# Patient Record
Sex: Female | Born: 1975 | Race: Black or African American | Hispanic: No | Marital: Married | Smoking: Former smoker
Health system: Southern US, Community
[De-identification: ages and names within clinical notes are randomized; demographics above are authoritative.]

## PROBLEM LIST (undated history)

## (undated) DIAGNOSIS — R87619 Unspecified abnormal cytological findings in specimens from cervix uteri: Secondary | ICD-10-CM

## (undated) DIAGNOSIS — Z9889 Other specified postprocedural states: Secondary | ICD-10-CM

## (undated) DIAGNOSIS — IMO0002 Reserved for concepts with insufficient information to code with codable children: Secondary | ICD-10-CM

## (undated) HISTORY — PX: FINGER SURGERY: SHX640

## (undated) HISTORY — DX: Other specified postprocedural states: Z98.890

## (undated) HISTORY — DX: Unspecified abnormal cytological findings in specimens from cervix uteri: R87.619

## (undated) HISTORY — PX: WISDOM TOOTH EXTRACTION: SHX21

## (undated) HISTORY — DX: Reserved for concepts with insufficient information to code with codable children: IMO0002

---

## 1998-06-21 ENCOUNTER — Ambulatory Visit (HOSPITAL_BASED_OUTPATIENT_CLINIC_OR_DEPARTMENT_OTHER): Admission: RE | Admit: 1998-06-21 | Discharge: 1998-06-21 | Payer: Self-pay | Admitting: Ophthalmology

## 2000-03-03 ENCOUNTER — Emergency Department (HOSPITAL_COMMUNITY): Admission: EM | Admit: 2000-03-03 | Discharge: 2000-03-03 | Payer: Self-pay | Admitting: Emergency Medicine

## 2000-09-03 ENCOUNTER — Emergency Department (HOSPITAL_COMMUNITY): Admission: EM | Admit: 2000-09-03 | Discharge: 2000-09-03 | Payer: Self-pay | Admitting: Emergency Medicine

## 2000-09-03 ENCOUNTER — Encounter: Payer: Self-pay | Admitting: Emergency Medicine

## 2006-10-01 ENCOUNTER — Observation Stay: Payer: Self-pay | Admitting: Obstetrics and Gynecology

## 2006-12-01 ENCOUNTER — Observation Stay: Payer: Self-pay | Admitting: Obstetrics and Gynecology

## 2006-12-07 ENCOUNTER — Inpatient Hospital Stay: Payer: Self-pay | Admitting: Obstetrics and Gynecology

## 2007-06-22 ENCOUNTER — Ambulatory Visit (HOSPITAL_BASED_OUTPATIENT_CLINIC_OR_DEPARTMENT_OTHER): Admission: RE | Admit: 2007-06-22 | Discharge: 2007-06-22 | Payer: Self-pay | Admitting: *Deleted

## 2007-06-22 ENCOUNTER — Encounter (INDEPENDENT_AMBULATORY_CARE_PROVIDER_SITE_OTHER): Payer: Self-pay | Admitting: *Deleted

## 2009-08-03 ENCOUNTER — Emergency Department (HOSPITAL_COMMUNITY): Admission: EM | Admit: 2009-08-03 | Discharge: 2009-08-03 | Payer: Self-pay | Admitting: Emergency Medicine

## 2011-03-21 ENCOUNTER — Inpatient Hospital Stay (HOSPITAL_COMMUNITY): Payer: BC Managed Care – PPO

## 2011-03-21 ENCOUNTER — Observation Stay (HOSPITAL_COMMUNITY)
Admission: AD | Admit: 2011-03-21 | Discharge: 2011-03-22 | DRG: 380 | Disposition: A | Discharge status: Home / Self Care | Payer: BC Managed Care – PPO | Source: Ambulatory Visit | Attending: Obstetrics & Gynecology | Admitting: Obstetrics & Gynecology

## 2011-03-21 DIAGNOSIS — O030 Genital tract and pelvic infection following incomplete spontaneous abortion: Secondary | ICD-10-CM

## 2011-03-21 LAB — ABO/RH: ABO/RH(D): AB POS

## 2011-03-21 LAB — DIFFERENTIAL
Basophils Absolute: 0 10*3/uL (ref 0.0–0.1)
Basophils Relative: 0 % (ref 0–1)
Lymphocytes Relative: 6 % — ABNORMAL LOW (ref 12–46)
Neutro Abs: 22.3 10*3/uL — ABNORMAL HIGH (ref 1.7–7.7)
Neutrophils Relative %: 87 % — ABNORMAL HIGH (ref 43–77)

## 2011-03-21 LAB — WET PREP, GENITAL
Clue Cells Wet Prep HPF POC: NONE SEEN
Yeast Wet Prep HPF POC: NONE SEEN

## 2011-03-21 LAB — CBC
Platelets: 356 10*3/uL (ref 150–400)
RBC: 4 MIL/uL (ref 3.87–5.11)
RDW: 12.9 % (ref 11.5–15.5)
WBC: 25.3 10*3/uL — ABNORMAL HIGH (ref 4.0–10.5)

## 2011-03-22 ENCOUNTER — Other Ambulatory Visit: Payer: Self-pay | Admitting: Obstetrics & Gynecology

## 2011-03-22 LAB — URINE CULTURE
Colony Count: NO GROWTH
Culture  Setup Time: 201204212116

## 2011-03-22 LAB — CBC
HCT: 24.2 % — ABNORMAL LOW (ref 36.0–46.0)
MCH: 30.6 pg (ref 26.0–34.0)
MCV: 89.3 fL (ref 78.0–100.0)
Platelets: 287 10*3/uL (ref 150–400)
RDW: 12.8 % (ref 11.5–15.5)

## 2011-03-23 LAB — GC/CHLAMYDIA PROBE AMP, GENITAL
Chlamydia, DNA Probe: NEGATIVE
GC Probe Amp, Genital: NEGATIVE

## 2011-03-23 LAB — STREP B DNA PROBE

## 2011-03-26 LAB — CROSSMATCH: Unit division: 0

## 2011-03-30 ENCOUNTER — Other Ambulatory Visit: Payer: Self-pay | Admitting: Obstetrics & Gynecology

## 2011-03-31 DEATH — deceased

## 2011-04-01 ENCOUNTER — Encounter (INDEPENDENT_AMBULATORY_CARE_PROVIDER_SITE_OTHER): Payer: BC Managed Care – PPO | Admitting: Obstetrics & Gynecology

## 2011-04-01 ENCOUNTER — Encounter: Payer: BC Managed Care – PPO | Admitting: Obstetrics & Gynecology

## 2011-04-01 DIAGNOSIS — R87619 Unspecified abnormal cytological findings in specimens from cervix uteri: Secondary | ICD-10-CM

## 2011-04-01 DIAGNOSIS — Z113 Encounter for screening for infections with a predominantly sexual mode of transmission: Secondary | ICD-10-CM

## 2011-04-01 DIAGNOSIS — O021 Missed abortion: Secondary | ICD-10-CM

## 2011-04-13 NOTE — Discharge Summary (Signed)
  Danielle West, Danielle West              ACCOUNT NO.:  [id number]  MEDICAL RECORD NO.:  [id number]           PATIENT TYPE:  I  LOCATION:  9373                          FACILITY:  [facility]  PHYSICIAN:  Lesly Dukes, M.D. DATE OF BIRTH:  [birth year]  DATE OF ADMISSION:  03/21/2011 DATE OF DISCHARGE:  03/22/2011                              DISCHARGE SUMMARY   DISCHARGE DIAGNOSIS:  Status post preterm delivery of a nonviable female at approximately 18 weeks and 5 days.  HOSPITAL COURSE:  The patient is a 35 year old G4, para 3-0-0-3, who presented at 18 weeks and 5 days with abdominal pain that had started 24 hours ago.  The patient got prenatal care at [facility] in [place].  The patient had ruptured her membranes just prior to coming to the hospital when she was in the EMS vehicle.  Upon morning of the preterm premature rupture of membranes, the patient was offered Cytotec induction and accepted and went on to deliver the baby at 12:52 a.m. on March 22, 2011.  The placenta delivered several hours later with moderate bleeding.  The patient's white blood cell count was elevated to approximately 26 and she had some uterine tenderness, so Zosyn was started, and she will be discharged with antibiotics with presumptive diagnosis of endometritis.  ADMISSION LABORATORY DATA:  White cell count 25.3, hemoglobin 12.4, hematocrit 36.1, platelets 356.  Postdelivery white cell count 26.6, hemoglobin 8.3, platelet count 287.  The patient is not orthostatic on day of discharge and ambulating well.  MEDICATIONS: 1. Motrin 600 mg one tablet p.o. q.6 h. p.r.n. pain. 2. Percocet 5/325 one to two tablets p.o. q.4 h. p.r.n. pain. 3. Cipro 500 mg one tablet p.o. q.12 h. for 10 days. 4. Flagyl 500 mg one tablet p.o. q.8 h. for 10 days. 5. FeSO4 - 325 one tablet p.o. daily. 6. Docusate 100 mg one tablet p.o. b.i.d.  DISCHARGE DIAGNOSES: 1. Premature delivery at 18 weeks 5 days with cramping  approximately 1     day prior to delivery.  No evidence of incompetent cervix. 2. Presumptive endometritis given white blood cell count and uterine     tenderness.  DISCHARGE INSTRUCTIONS: 1. Call the office or come to the ER with temperature of 100.4 or     greater, severe abdominal pain, intractable nausea, vomiting,     diarrhea. 2. Nothing per vagina for 2 weeks. 3. No work for 1 week.  DISCHARGE FOLLOWUP:  The patient was offered an appointment at [facility] that number was given to her or she can follow up with her primary care doctor in [facility]     Lesly Dukes, M.D.     Danielle West  D:  03/22/2011  T:  03/22/2011  Job:  [id number]  Electronically Signed by Elsie Lincoln M.D. on 04/13/2011 10:28:31 AM

## 2011-04-14 NOTE — Op Note (Signed)
[name], Danielle West              ACCOUNT NO.:  [id number]   MEDICAL RECORD NO.:  [id number]          PATIENT TYPE:  AMB   LOCATION:  DSC                          FACILITY:  [facility]   PHYSICIAN:  Tennis Must Meyerdierks, M.D.DATE OF BIRTH:  08-28-1976   DATE OF PROCEDURE:  06/22/2007  DATE OF DISCHARGE:                               OPERATIVE REPORT   PREOPERATIVE DIAGNOSIS:  Mass, right long finger.   POSTOPERATIVE DIAGNOSIS:  Mass, right long finger.   PROCEDURE:  Excision of mass, right long finger.   SURGEON:  Lowell Bouton, M.D.   ANESTHESIA:  General.   OPERATIVE FINDINGS:  There was no cystic fluid in the mass, and it was  questionable whether or not the mass was a verruca.  It was at the nail  fold and the history given was that it was a bite under water.  The  patient said that she had seen some drainage in the past but not  recently.   PROCEDURE:  Under general anesthesia with a tourniquet on the right arm,  the right hand was prepped and draped in the usual fashion and after  exsanguinating the limb, the tourniquet was inflated to 250 mmHg.  A V-  shaped incision was made just proximal to the mass at the DIP joint.  Sharp dissection was carried through the subcutaneous tissues and a  distally-based flap was elevated.  No cystic fluid was found in the  subcutaneous tissues.  The mass was approached from proximal to distal  and appeared to mainly involve the skin.  The top layer of skin was then  shaved off.  The mass was cauterized with an eye cautery on the skin and  in the soft tissues.  The wound was irrigated with saline.  The skin was  closed with 4-0 nylon sutures.  Marcaine 0.5% digital block was inserted  for pain control.  Sterile dressings were applied.  The patient went to  the recovery room awake in stable, in good condition.      Lowell Bouton, M.D.  Electronically Signed     EMM/MEDQ  D:  06/22/2007  T:  06/22/2007  Job:   [id number]   cc:   Patrica Duel, M.D.

## 2011-05-20 ENCOUNTER — Ambulatory Visit (INDEPENDENT_AMBULATORY_CARE_PROVIDER_SITE_OTHER): Payer: BC Managed Care – PPO | Admitting: Family Medicine

## 2011-05-20 DIAGNOSIS — N898 Other specified noninflammatory disorders of vagina: Secondary | ICD-10-CM

## 2011-05-21 NOTE — Assessment & Plan Note (Signed)
NAME:  Danielle West, Danielle West NO.:  [id number]  MEDICAL RECORD NO.:  [id number]           PATIENT TYPE:  LOCATION:  [facility] at [facility]           FACILITY:  PHYSICIAN:  Tinnie Gens, MD             DATE OF BIRTH:  DATE OF SERVICE:  05/20/2011                                 CLINIC NOTE  CHIEF COMPLAINT:  Yellowish mucousy discharge.  HISTORY OF PRESENT ILLNESS:  The patient is a 35 year old gravida 5, para 3-0-2-3 who comes in today because she has had yellowish discharge since she lost her baby at 75 weeks.  She had a bacterial infection that she thinks is what caused her miscarriage.  She wonders if her husband might be cheating on her.  She comes in today because she has continued to have this yellowish mucousy discharge since then and has an odor occasionally.  She denies fevers, chills, lower abdominal pain, nausea or vomiting.  On exam, vitals are as noted in the chart.  She is well-developed, well- nourished female in no acute distress.  GU, normal external female genitalia.  BUS is normal.  Vagina is pink and rugated.  There is a white mucousy discharge noted.  The cervix is normal in appearance.  It is parous without lesions.  Uterus is an 8-week size, anteverted, firm. There is no cervical motion tenderness.  No adnexal tenderness.  IMPRESSION:  Vaginal discharge of unclear etiology.  PLAN:  Wet prep, GC and Chlamydia, also may be hormonally mediated. Discussed this with the patient and after cycle gets back on track, hopefully this will improve if nothing comes back.  We will call her with results tomorrow.          ______________________________ Tinnie Gens, MD    TP/MEDQ  D:  05/20/2011  T:  05/21/2011  Job:  [id number]

## 2011-05-26 ENCOUNTER — Encounter: Payer: BC Managed Care – PPO | Admitting: Family Medicine

## 2011-06-12 NOTE — Assessment & Plan Note (Signed)
NAME:  Danielle, West NO.:  [id number]  MEDICAL RECORD NO.:  [id number]           PATIENT TYPE:  LOCATION:  [facility] at [facility]           FACILITY:  PHYSICIAN:  Danielle Bossier, MD        DATE OF BIRTH:  Feb 22, 1976  DATE OF SERVICE:  04/01/2011                                 CLINIC NOTE  Danielle West is a 35 year old married black G5, P3, A2.  I first met her 2 weeks ago at the [facility] in the [facility] when she came in with ruptured membranes at 19 weeks estimated gestational age.  Her white count was elevated at that time, but she had no other symptoms.  After a long discussion with her, she did opt for Cytotec induction.  Please note that when she came in with her water broken, she was already contracting.  She spontaneously delivered a nonviable infant followed by the placenta several hours later.  Since she has gone home, she says her bleeding is normal and she reports that her mood is improving on a daily basis.  She denies homicidal or suicidal ideation and she does not feel that she needs medication for depression at this time.  She would, however, like a refill on her acyclovir that she uses on a p.r.n. basis.  PAST MEDICAL HISTORY:  HSV, history of ASCUS Pap.  She says her Pap smear was due in April 2012.  She will do it today.  She has decided to change her care here from her previous OB/GYN in [place].  PHYSICAL EXAMINATION:  Her external genitalia is normal.  Her cervix is parous with a small amount of brownish mucousy discharge.  Her uterus is involuted, normal size, and shape, mobile.  Her adnexa are nontender.  ASSESSMENT/PLAN: 1. A 2-1/2 weeks status post normal spontaneous vaginal delivery of 19-     week fetus.  She is stable. 2. History of atypical squamous cells of undetermined significance     Pap.  I repeated her Pap smear today and will try to obtain the     records from her previous OB/GYN 3. Due to childbearing plans, she would like  to conceive again as soon     as possible.  I recommended that she continue her prenatal vitamins     and use Abstinence for 6 weeks and then condoms for another 6 weeks     prior to trying to conceive.  All questions were answered.  She     will come back in 6 months or sooner as necessary.     Danielle Bossier, MD    MCD/MEDQ  D:  04/01/2011  T:  04/01/2011  Job:  [id number]

## 2011-07-03 ENCOUNTER — Other Ambulatory Visit: Payer: Self-pay | Admitting: *Deleted

## 2011-07-03 DIAGNOSIS — A6000 Herpesviral infection of urogenital system, unspecified: Secondary | ICD-10-CM

## 2011-07-03 MED ORDER — VALACYCLOVIR HCL 1 G PO TABS: 1000.0000 mg | ORAL_TABLET | Freq: Every day | ORAL | Status: AC

## 2011-07-03 NOTE — Telephone Encounter (Signed)
Patient is getting ready to lose her insurance for a year due to school.  She would like to have a refill of her Valtrex called in to be sure she has it in case of a breakout of her HSV.

## 2011-09-10 ENCOUNTER — Ambulatory Visit (INDEPENDENT_AMBULATORY_CARE_PROVIDER_SITE_OTHER): Payer: BC Managed Care – PPO | Admitting: Family Medicine

## 2011-09-10 ENCOUNTER — Encounter: Payer: Self-pay | Admitting: Family Medicine

## 2011-09-10 DIAGNOSIS — F17200 Nicotine dependence, unspecified, uncomplicated: Secondary | ICD-10-CM | POA: Insufficient documentation

## 2011-09-10 DIAGNOSIS — A609 Anogenital herpesviral infection, unspecified: Secondary | ICD-10-CM | POA: Insufficient documentation

## 2011-09-10 DIAGNOSIS — B009 Herpesviral infection, unspecified: Secondary | ICD-10-CM

## 2011-09-10 DIAGNOSIS — A6000 Herpesviral infection of urogenital system, unspecified: Secondary | ICD-10-CM

## 2011-09-10 NOTE — Progress Notes (Signed)
  Subjective:    Patient ID: Danielle West, female    DOB: 30-Jan-1976, 35 y.o.   MRN: [id number]  HPI Here because she was late, but period started.  Actively trying to conceive.  Having HSV outbreaks q o month. Negative UPT at home.   Review of Systems  Constitutional: Negative for activity change and fatigue.  Respiratory: Negative for chest tightness and shortness of breath.   Gastrointestinal: Negative for abdominal pain and abdominal distention.  Genitourinary: Negative for dysuria, menstrual problem and pelvic pain.       Objective:   Physical Exam  Vitals reviewed. Constitutional: She appears well-developed and well-nourished.  HENT:  Head: Normocephalic and atraumatic.  Neck: Normal range of motion.  Cardiovascular: Normal rate.   Pulmonary/Chest: Effort normal.  Abdominal: Soft.  Skin: Skin is warm and dry.          Assessment & Plan:  Frequent HSV outbreaks. Preconception Daily HSV suppression Timing of intercourse reviewed.

## 2011-09-10 NOTE — Patient Instructions (Signed)
Preparing for Pregnancy Preparing for pregnancy (preconceptual care) by getting counseling and information from your caregiver before getting pregnant is a good idea. It will help you and your baby have a better chance to have a healthy, safe pregnancy and delivery of your baby. Make an appointment with your caregiver to talk about your health, medical and family history and how to prepare yourself before getting pregnant. Your caregiver will do a complete physical exam and a Pap test. They will want to know:  About you, your spouse/partner and your family's medical and genetic history.   If you are eating a balanced diet and drinking enough fluids.   What vitamins and mineral supplements you are taking. This includes taking folic acid before getting pregnant to help prevent birth defects.   What medications you are taking including prescription, over-the-counter and herbal medications.   If there is any substance abuse like alcohol, smoking and illegal drugs.   If there is any mental or physical domestic violence.   If there is any risk of sexually transmitted disease between you and your partner.   What immunizations and vaccinations you have had and what you may need before getting pregnant.   If you should get tested for HIV infection.   If there is any exposure to chemical or toxic substances at home or work.   If there are medical problems you have that need to be treated and kept under control before getting pregnant such as diabetes, high blood pressure or others.   If there were any past surgeries, pregnancies and problems with them.   What your current weight is and to set a goal as to how much weight you should gain while pregnant. Also, they will check if you should lose or gain weight before getting pregnant.   What is your exercise routine and what it is safe when you are pregnant.   If there are any physical disabilities that need to be addressed.   About spacing your  pregnancies when there are other children.   If there is a financial problem that may affect you having a child.  After talking about the above points with your caregiver, your caregiver will give you advice on how to help treat and work with you on solving any issues, if necessary, before getting pregnant. The goal is to have a healthy and safe pregnancy for you and your baby. You should keep an accurate record of your menstrual periods because it will help in determining your due date. Immunizations that you should have before getting pregnant:   Regular measles, [place] measles (rubella) and mumps.  Tetanus and diphtheria.   Chicken pox, if not immune.   Herpes zoster (Varicella) if not immune.  Human papilloma virus vaccine (HPV) between the age of 52 and 14 years old).   Hepatitis A vaccine.  Hepatitis B vaccine.   Influenza vaccine.   Pneumococcal vaccine (pneumonia).   You should avoid getting pregnant for one month after getting vaccinated with a live virus vaccine such as [place] measles (rubella) vaccine. Other immunizations may be necessary depending on where you live, such as malaria. Ask your caregiver if any other immunizations are needed for you. HOME CARE INSTRUCTIONS  Follow the advice of your caregiver.   Before getting pregnant:   Begin taking vitamins, supplements and folic acid 0.4 milligrams/day.   Get your immunizations up to date.   Get help from a nutrition counselor if you do not understand what a balanced diet  is, need help with a special medical diet or if you need help to lose or gain weight.   Begin exercising.   Stop smoking, taking illegal drugs and drinking alcoholic beverages.   Get counseling if there is and type of domestic violence.   Get checked for sexually transmitted diseases including HIV.   Get any medical problems under control (diabetes, high blood pressure, convulsions, asthma or others).   Resolve any financial concerns.   Be  sure you and your spouse/partner are ready to have a baby.   Keep an accurate record of your menstrual periods.  Document Released: 10/29/2008  [facility] Patient Information 2011 [place], [place].Herpes Simplex   Herpes simplex is generally classified as Type 1 or Type 2.  Type 1 is generally the type that is responsible for cold sores.  Type 2 is generally associated with sexually transmitted diseases.  We now know that most of the thoughts on these viruses are inaccurate.  We find that HSV1 is also present genitally and HSV2 can be present orally, but this will vary in different locations of the world.  Herpes simplex is usually detected by doing a culture. Blood tests are also available for this virus; however, the accuracy is often not as good.    PREPARATION FOR TEST: No preparation or fasting is necessary.   NORMAL FINDINGS:  No virus present  No HSV antigens or antibodies present   MEANING OF TEST Your caregiver will go over the test results with you and discuss the importance and meaning of your results, as well as treatment options and the need for additional tests if necessary.   OBTAINING THE TEST RESULTS It is your responsibility to obtain your test results.  Ask the lab or department performing the test when and how you will get your results.   **"Normal" ranges for lab values and other tests may vary among different laboratories and/or hospitals.  You should always check with your doctor after having lab work or other tests done to discuss the meaning of your test results and whether or not your values are considered "within normal limits".   Document Released: 12/19/2004  Document Re-Released: 10/29/2008 [facility] Patient Information 2011 [place], [place].

## 2011-09-14 LAB — POCT HEMOGLOBIN-HEMACUE
Hemoglobin: 14.2
Operator id: 208731

## 2011-12-10 ENCOUNTER — Ambulatory Visit: Payer: BC Managed Care – PPO | Admitting: Obstetrics & Gynecology

## 2012-02-02 ENCOUNTER — Encounter: Payer: Self-pay | Admitting: Obstetrics and Gynecology

## 2012-02-02 ENCOUNTER — Ambulatory Visit (INDEPENDENT_AMBULATORY_CARE_PROVIDER_SITE_OTHER): Payer: BC Managed Care – PPO | Admitting: Obstetrics and Gynecology

## 2012-02-02 VITALS — BP 128/88 | HR 86 | Ht 63.0 in | Wt 137.0 lb

## 2012-02-02 DIAGNOSIS — Z113 Encounter for screening for infections with a predominantly sexual mode of transmission: Secondary | ICD-10-CM

## 2012-02-02 DIAGNOSIS — N760 Acute vaginitis: Secondary | ICD-10-CM

## 2012-02-02 NOTE — Progress Notes (Signed)
36 yo [id number] presenting today for evaluation of vaginal discharge. Patient denies pain or pruritis but reports the presence of thick yellow mucous-like discharge without odor on a weekly basis. Patient is in a monogamous relationship with her husband. Patient also reports experiencing irregular menses, some months being every 26 days, others every 27 days and at times every 28 days. Reassurance provided on this normal variation in her menstrual cycle.  Wet prep and GC/Chl cultures provided Patient will be contacted with any abnormal results.

## 2012-02-03 LAB — WET PREP, GENITAL
Clue Cells Wet Prep HPF POC: NONE SEEN
Trich, Wet Prep: NONE SEEN
Yeast Wet Prep HPF POC: NONE SEEN

## 2012-02-03 LAB — GC/CHLAMYDIA PROBE AMP, GENITAL
Chlamydia, DNA Probe: NEGATIVE
GC Probe Amp, Genital: NEGATIVE

## 2012-09-25 ENCOUNTER — Ambulatory Visit: Payer: Self-pay | Admitting: Internal Medicine

## 2012-11-21 ENCOUNTER — Ambulatory Visit: Payer: BC Managed Care – PPO | Admitting: Family Medicine

## 2012-11-25 ENCOUNTER — Ambulatory Visit (INDEPENDENT_AMBULATORY_CARE_PROVIDER_SITE_OTHER): Payer: BC Managed Care – PPO | Admitting: Family Medicine

## 2012-11-25 ENCOUNTER — Encounter: Payer: Self-pay | Admitting: Family Medicine

## 2012-11-25 VITALS — BP 120/78 | HR 103 | Temp 98.5°F | Ht 63.0 in | Wt 137.0 lb

## 2012-11-25 DIAGNOSIS — N926 Irregular menstruation, unspecified: Secondary | ICD-10-CM

## 2012-11-25 DIAGNOSIS — N912 Amenorrhea, unspecified: Secondary | ICD-10-CM

## 2012-11-25 DIAGNOSIS — J069 Acute upper respiratory infection, unspecified: Secondary | ICD-10-CM

## 2012-11-25 LAB — POCT URINE PREGNANCY: Preg Test, Ur: NEGATIVE

## 2012-11-25 NOTE — Progress Notes (Signed)
   Nature conservation officer at [facility] [address] [place] [address] Phone: [phone] Fax: [phone]  Date:  11/25/2012   Name:  Danielle West   DOB:  Jan 10, 1976   MRN:  [id number] Gender: female Age: 36 y.o.  PCP:  Hannah Beat, MD  Evaluating MD: Hannah Beat, MD   Chief Complaint: Establish Care   History of Present Illness:  Danielle West is a 36 y.o. pleasant patient who presents with the following:  Est care and  Trying to get pregnant for 6 months.  36 yo and 36 yo.  The patient has some runny nose, nasal congestion and some occasional cough. She is afebrile. She is somewhat tired. She is eating and drinking normally. No nausea, vomiting, or diarrhea.  Paps -- goes to Dr. Toya Smothers.     There is no problem list on file for this patient.   No past medical history on file.  No past surgical history on file.  History  Substance Use Topics  . Smoking status: Current Some Day Smoker  . Smokeless tobacco: Not on file  . Alcohol Use: Yes    No family history on file.  Allergies not on file  Medication list has been reviewed and updated.  No outpatient prescriptions prior to visit.    Last reviewed on 11/25/2012  1:58 PM by Consuello Masse, CMA  Review of Systems:  ROS: GEN: Acute illness details above GI: Tolerating PO intake GU: maintaining adequate hydration and urination Pulm: No SOB Interactive and getting along well at home.  Otherwise, ROS is as per the HPI.   Physical Examination: BP 120/78  Pulse 103  Temp 98.5 F (36.9 C) (Oral)  Ht 5\' 3"  (1.6 m)  Wt 137 lb (62.143 kg)  BMI 24.27 kg/m2  SpO2 98%  Ideal Body Weight: Weight in (lb) to have BMI = 25: 140.8    Gen: WDWN, NAD; A & O x3, cooperative. Pleasant.Globally Non-toxic HEENT: Normocephalic and atraumatic. Throat clear, w/o exudate, R TM clear, L TM - good landmarks, No fluid present. rhinnorhea.  MMM Frontal sinuses: NT Max sinuses: NT NECK:  Anterior cervical  LAD is absent CV: RRR, No M/G/R, cap refill <2 sec PULM: Breathing comfortably in no respiratory distress. no wheezing, crackles, rhonchi EXT: No c/c/e PSYCH: Friendly, good eye contact MSK: Nml gait    Assessment and Plan:  1. URI (upper respiratory infection)    2. Missed menses  POCT urine pregnancy   Reviewed supportive URI care.  Missed menses, and UPT is negative. I suggested that she obtain some home pregnancy test and check every 3-4 days as she is actively trying to get pregnant.  Results for orders placed in visit on 11/25/12  POCT URINE PREGNANCY      Component Value Range   Preg Test, Ur Negative       Orders Today:  Orders Placed This Encounter  Procedures  . POCT urine pregnancy    Updated Medication List: (Includes new medications, updates to list, dose adjustments) No orders of the defined types were placed in this encounter.    Medications Discontinued: There are no discontinued medications.   Hannah Beat, MD

## 2012-12-31 HISTORY — PX: COLPOSCOPY: SHX161

## 2013-01-27 ENCOUNTER — Ambulatory Visit: Payer: BC Managed Care – PPO | Admitting: Family Medicine

## 2013-04-05 ENCOUNTER — Encounter: Payer: BC Managed Care – PPO | Admitting: *Deleted

## 2013-10-21 ENCOUNTER — Inpatient Hospital Stay: Payer: Self-pay | Admitting: Obstetrics and Gynecology

## 2013-10-21 LAB — CBC WITH DIFFERENTIAL/PLATELET
Basophil #: 0.1 10*3/uL (ref 0.0–0.1)
Eosinophil #: 0.1 10*3/uL (ref 0.0–0.7)
Eosinophil %: 0.4 %
HGB: 12.2 g/dL (ref 12.0–16.0)
Lymphocyte #: 1.4 10*3/uL (ref 1.0–3.6)
MCH: 29.9 pg (ref 26.0–34.0)
MCHC: 33.6 g/dL (ref 32.0–36.0)
MCV: 89 fL (ref 80–100)
Monocyte #: 1 x10 3/mm — ABNORMAL HIGH (ref 0.2–0.9)
Monocyte %: 7.8 %
Neutrophil #: 10.3 10*3/uL — ABNORMAL HIGH (ref 1.4–6.5)
Platelet: 355 10*3/uL (ref 150–440)
RDW: 15.4 % — ABNORMAL HIGH (ref 11.5–14.5)
WBC: 12.7 10*3/uL — ABNORMAL HIGH (ref 3.6–11.0)

## 2014-01-01 LAB — HM PAP SMEAR

## 2014-01-01 LAB — HM COLONOSCOPY

## 2014-02-14 ENCOUNTER — Ambulatory Visit: Payer: BC Managed Care – PPO | Admitting: Family Medicine

## 2014-03-05 HISTORY — PX: LEEP: SHX91

## 2014-05-10 IMAGING — CR DG LUMBAR SPINE AP/LAT/OBLIQUES W/ FLEX AND EXT
1 series · 6 of 6 positions shown · non-contrast
Comparison: none

REASON FOR EXAM: low back pain, tenderness and numbness
COMMENTS:

PROCEDURE:     DXR - DXR LUMBAR SPINE WITH OBLIQUES  - September 25, 2012 [DATE]
RESULT:     Five non-rib bearing lumbar vertebral bodies are appreciated.
There is no evidence of fracture, dislocation or malalignment.

[Series 1: t lumbar spine ap · 0.14mm/px · 6 of 6 slices shown]
[im 1/6]
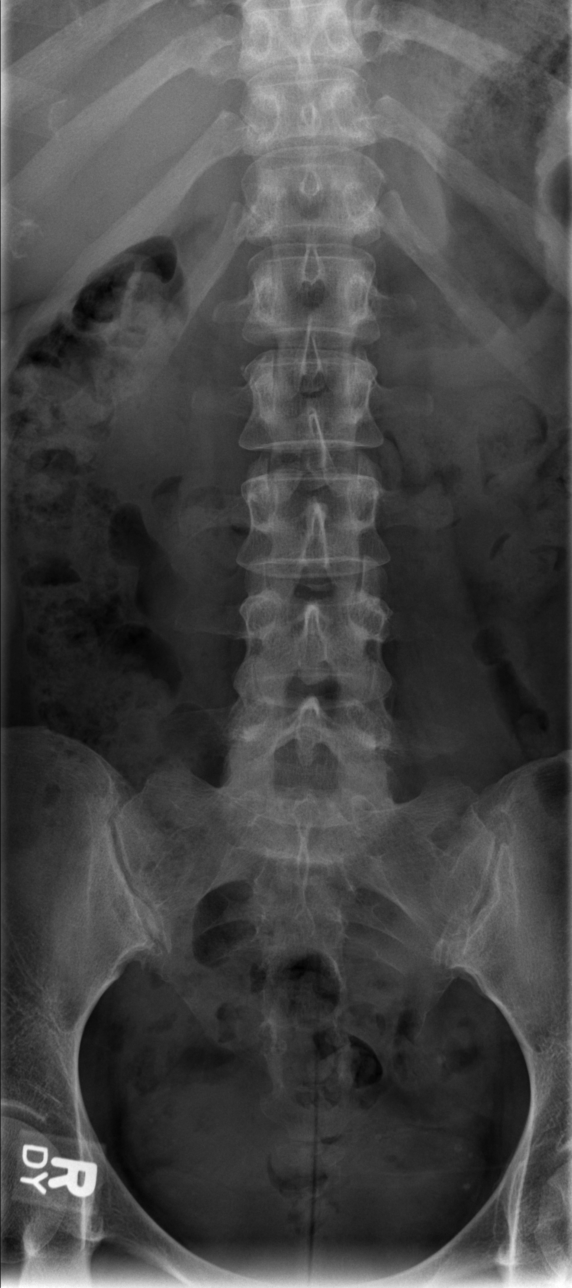
[im 2/6]
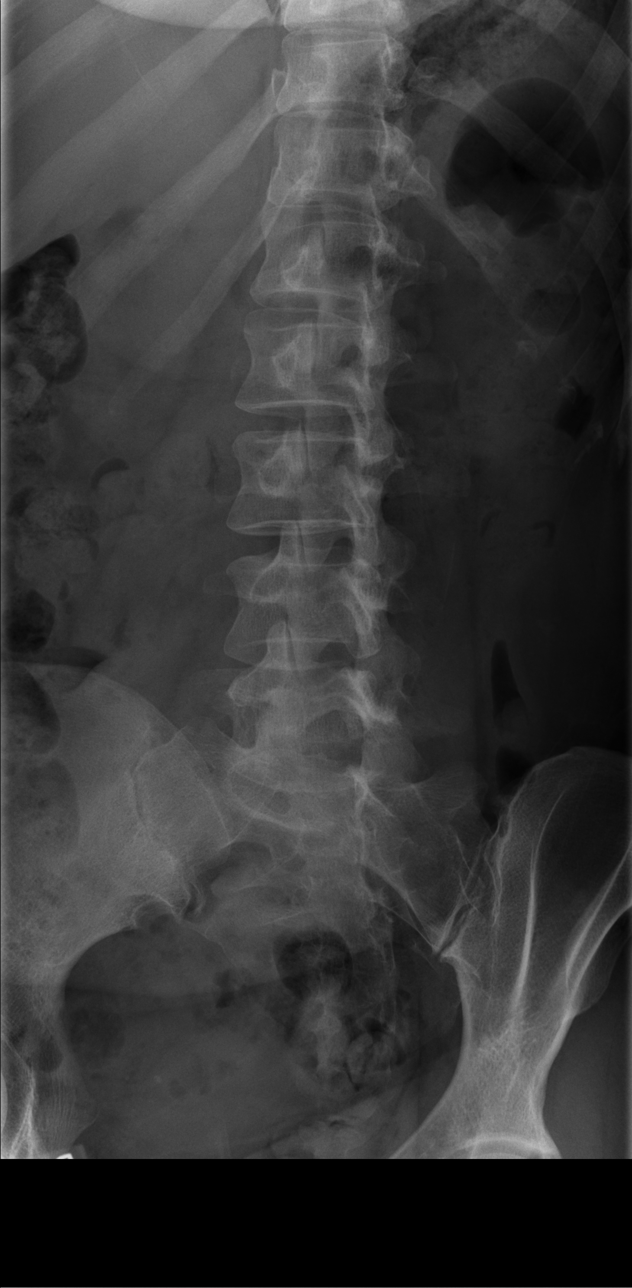
[im 3/6]
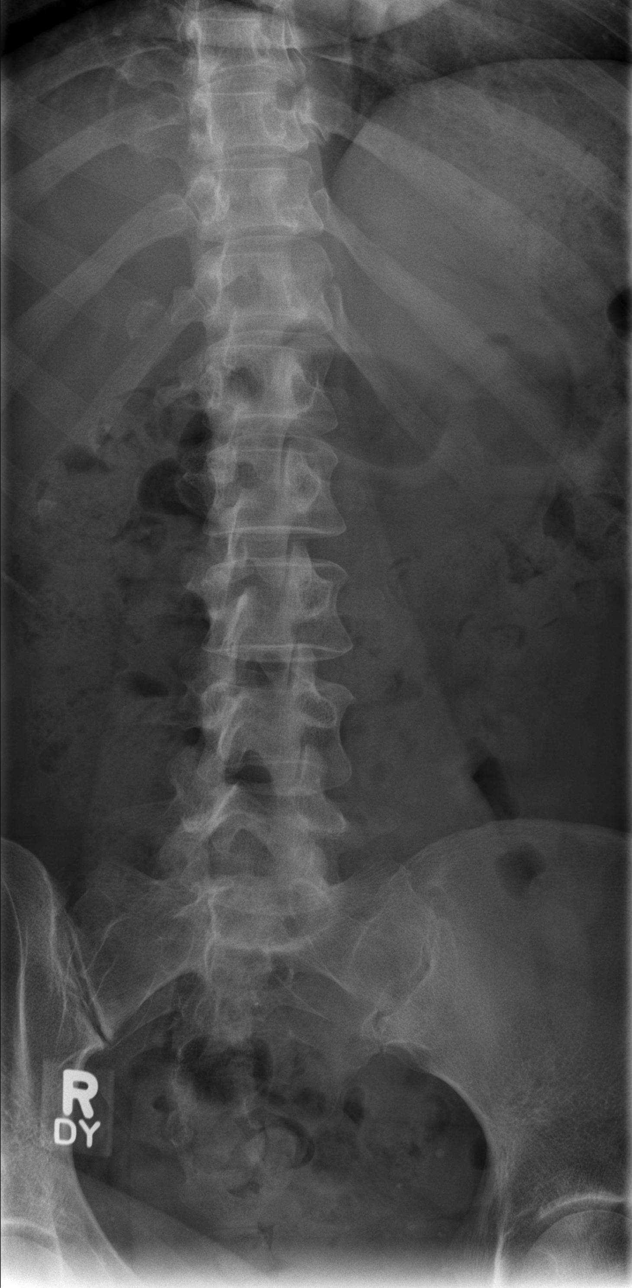
[im 4/6]
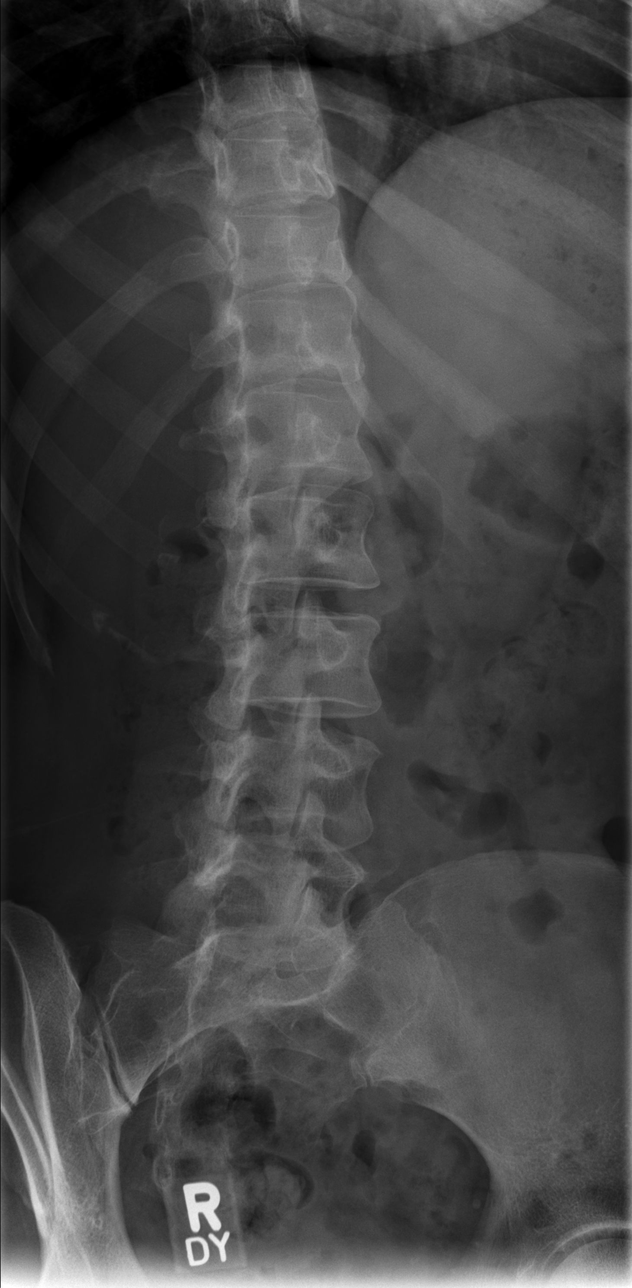
[im 5/6]
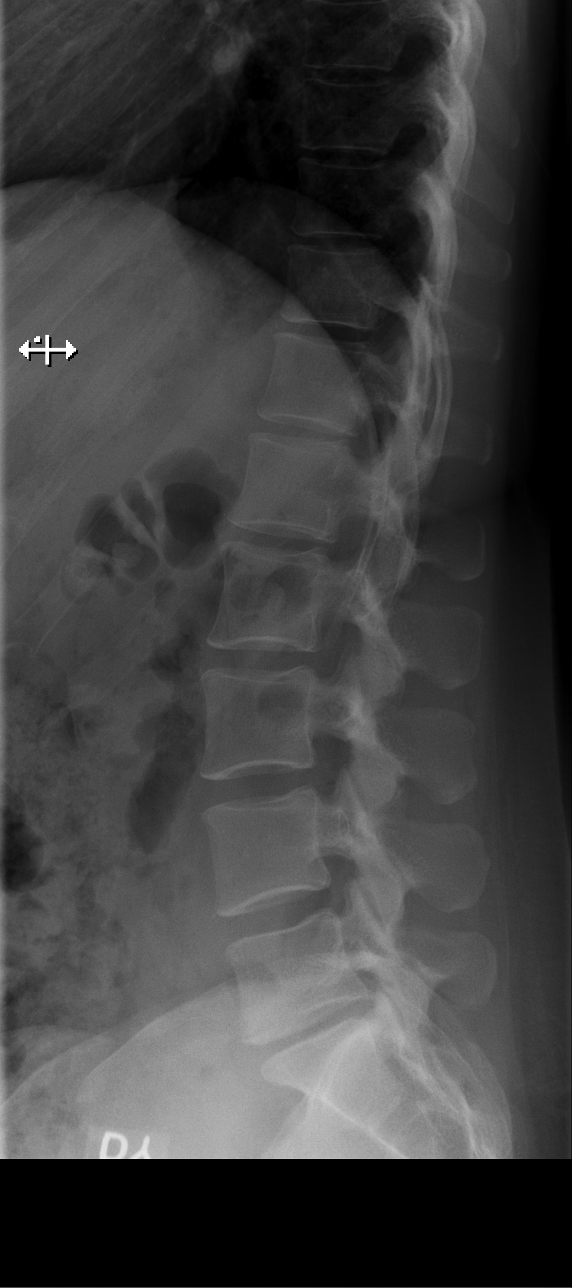
[im 6/6]
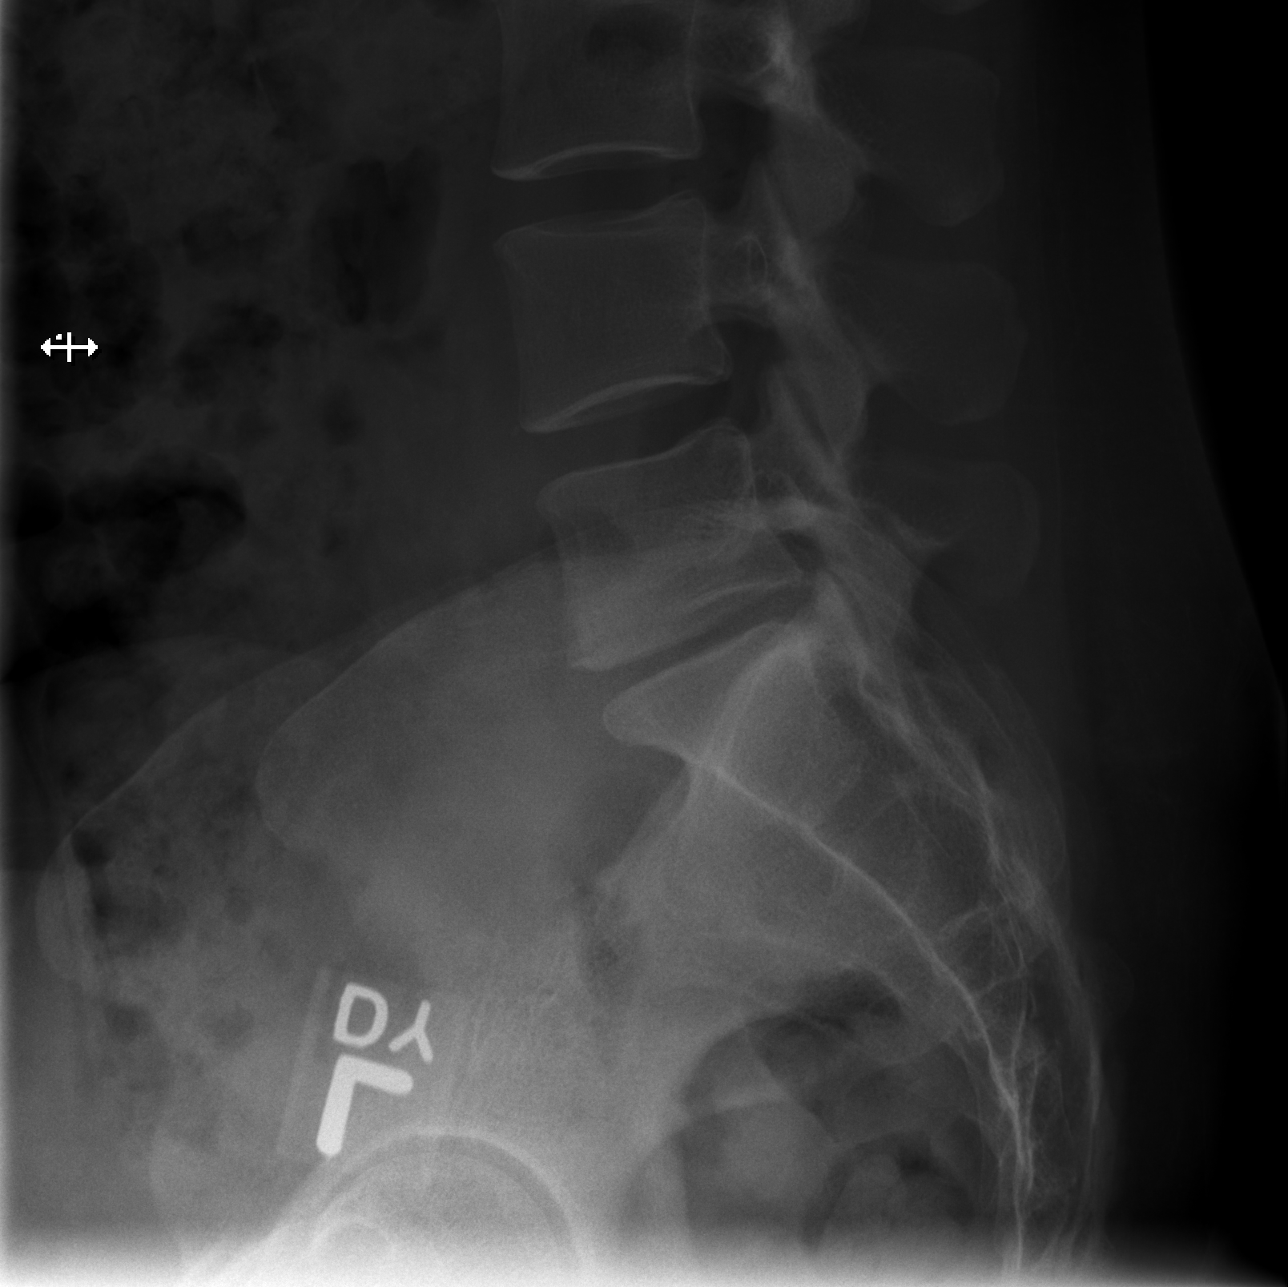

[6 of 6 positions shown; findings below may reference images not displayed]

IMPRESSION: 1. No acute osseous abnormalities.
2. If there is persistent clinical concern or persistent complaints of pain,
further evaluation with MRI is recommended.

## 2014-05-21 ENCOUNTER — Encounter: Payer: Self-pay | Admitting: Obstetrics and Gynecology

## 2014-10-01 ENCOUNTER — Encounter: Payer: Self-pay | Admitting: Obstetrics and Gynecology

## 2014-10-10 LAB — HM PAP SMEAR: HM PAP: NEGATIVE

## 2014-10-10 LAB — HM COLONOSCOPY

## 2015-03-01 HISTORY — PX: THERAPEUTIC ABORTION: SHX798

## 2015-04-09 NOTE — H&P (Signed)
L&D Evaluation:  History:  HPI 37 yoAAF [id number], EGA 39.0 weeks, estimated date of confinement 10/28/2013, admitted for IOL. 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   Patient's Medical History AMA; HSV 2; ASCUS Pap with +HPV   Patient's Surgical History none   Medications Pre Natal Vitamins  Acyclovir   Allergies NKDA   Social History none   Family History Non-Contributory   ROS:  ROS All systems were reviewed.  HEENT, CNS, GI, GU, Respiratory, CV, Renal and Musculoskeletal systems were found to be normal.   Exam:  Vital Signs stable   Urine Protein negative dipstick   General no apparent distress   Mental Status clear   Abdomen gravid, non-tender   Estimated Fetal Weight Average for gestational age, 7#6 EFW   Back no CVAT   Edema no edema   Pelvic no external lesions, 4/40/-3/VTX/AROM Clear   Mebranes Ruptured   Description clear   FHT normal rate with no decels   Skin dry, no lesions, no rashes   Lymph no lymphadenopathy   Other AB+/Atb-/NR/RI/VI/HB-/HIV-/GBS-/MSAFP quad-   Impression:  Impression TIUP; Hx HSV, on prophylaxis, without outbreak/prodrome   Plan:  Plan Pitocin IOL; Anticipate spontaneous vaginal delivery   Electronic Signatures: Anastacio Bua, Prentice DockerMartin A (MD)  (Signed [phone]-Nov-14 11:21)  Authored: L&D Evaluation   Last Updated: 22-Nov-14 11:21 by Vanity Larsson, Prentice DockerMartin A (MD)

## 2015-04-09 NOTE — H&P (Signed)
L&D Evaluation:  History:  HPI [age] yoaaf G6P2123, estimated date of confinement 10/28/2013, EGA 39.0 weeks admitted for IOL   Patient's Medical History HSV 2; AMA; ASC   Electronic Signatures: Parsa Rickett, Prentice DockerMartin A (MD)  (Signed [phone]-Nov-14 11:08)  Authored: L&D Evaluation   Last Updated: 22-Nov-14 11:08 by Akita Maxim, Prentice DockerMartin A (MD)

## 2015-05-16 ENCOUNTER — Ambulatory Visit (INDEPENDENT_AMBULATORY_CARE_PROVIDER_SITE_OTHER): Payer: BC Managed Care – PPO | Admitting: Obstetrics and Gynecology

## 2015-05-16 ENCOUNTER — Telehealth: Payer: Self-pay | Admitting: Obstetrics and Gynecology

## 2015-05-16 ENCOUNTER — Encounter: Payer: Self-pay | Admitting: Obstetrics and Gynecology

## 2015-05-16 VITALS — BP 162/98 | HR 102 | Ht 65.0 in | Wt 147.5 lb

## 2015-05-16 DIAGNOSIS — Z3201 Encounter for pregnancy test, result positive: Secondary | ICD-10-CM | POA: Diagnosis not present

## 2015-05-16 DIAGNOSIS — B379 Candidiasis, unspecified: Secondary | ICD-10-CM

## 2015-05-16 DIAGNOSIS — N912 Amenorrhea, unspecified: Secondary | ICD-10-CM | POA: Diagnosis not present

## 2015-05-16 LAB — POCT URINE PREGNANCY: PREG TEST UR: POSITIVE — AB

## 2015-05-16 MED ORDER — SERTRALINE HCL 50 MG PO TABS: 50.0000 mg | ORAL_TABLET | Freq: Every day | ORAL | Status: DC

## 2015-05-16 MED ORDER — FLUCONAZOLE 150 MG PO TABS: 150.0000 mg | ORAL_TABLET | Freq: Once | ORAL | Status: DC

## 2015-05-16 NOTE — Telephone Encounter (Signed)
Talked with pharmacy, they deleted order, and ask [place] to send another one.  This was done-quantity #30, 1 refill.

## 2015-05-16 NOTE — Telephone Encounter (Signed)
Pharmacy [facility] in [place] called wants a call back from you to verify the quantity of the RX that was sent in for [place], pharmacy tech's name is Tobi Bastos and number is [phone]

## 2015-05-16 NOTE — Progress Notes (Deleted)
Patient ID: Danielle West, female   DOB: [birth year], 39 y.o.   MRN: [id number] ANNUAL PREVENTATIVE CARE GYN  ENCOUNTER NOTE   Current Outpatient Prescriptions on File Prior to Visit  Medication Sig Dispense Refill  . valACYclovir (VALTREX) 1000 MG tablet Take 1 tablet (1,000 mg total) by mouth daily. (Patient taking differently: Take 1,000 mg by mouth as needed. ) 30 tablet 3  . Prenatal Vit-Fe Fumarate-FA (MULTIVITAMIN-PRENATAL) 27-0.8 MG TABS Take 1 tablet by mouth daily.       No current facility-administered medications on file prior to visit.    Allergies  Allergen Reactions  . Sulfa Antibiotics Rash    History   Social History  . Marital Status: Married    Spouse Name: N/A  . Number of Children: N/A  . Years of Education: N/A   Occupational History  . teacher    Social History Main Topics  . Smoking status: Current Some Day Smoker -- 0.30 packs/day    Types: Cigarettes  . Smokeless tobacco: Never Used  . Alcohol Use: 0.0 oz/week    0 Standard drinks or equivalent per week     Comment: ocasionally   . Drug Use: No  . Sexual Activity:    Partners: Male   Other Topics Concern  . Not on file   Social History Narrative   ** Merged History Encounter **        Family History  Problem Relation Age of Onset  . Diabetes Maternal Grandmother   . Hypertension Father   . Cancer Father     thyriod, prostate  . Diabetes Mother   . Hypertension Mother   . Cancer Mother     breast cancer  . Cancer Brother     kidney  . Diabetes Sister   . Lupus Maternal Aunt   . Cancer Paternal Grandmother     breast cancer    The following portions of the patient's history were reviewed and updated as appropriate: allergies, current medications, past family history, past medical history, past social history, past surgical history and problem list.  Review of Systems ROS Review of Systems - General ROS: negative for - chills, fatigue, fever, hot flashes, night sweats, weight  gain or weight loss Psychological ROS: negative for - anxiety, decreased libido, depression, mood swings, physical abuse or sexual abuse Ophthalmic ROS: negative for - blurry vision, eye pain or loss of vision ENT ROS: negative for - headaches, hearing change, visual changes or vocal changes Allergy and Immunology ROS: negative for - hives, itchy/watery eyes or seasonal allergies Hematological and Lymphatic ROS: negative for - bleeding problems, bruising, swollen lymph nodes or weight loss Endocrine ROS: negative for - galactorrhea, hair pattern changes, hot flashes, malaise/lethargy, mood swings, palpitations, polydipsia/polyuria, skin changes, temperature intolerance or unexpected weight changes Breast ROS: negative for - new or changing breast lumps or nipple discharge Respiratory ROS: negative for - cough or shortness of breath Cardiovascular ROS: negative for - chest pain, irregular heartbeat, palpitations or shortness of breath Gastrointestinal ROS: no abdominal pain, change in bowel habits, or black or bloody stools Genito-Urinary ROS: no dysuria, trouble voiding, or hematuria Musculoskeletal ROS: negative for - joint pain or joint stiffness Neurological ROS: negative for - bowel and bladder control changes Dermatological ROS: negative for rash and skin lesion changes   Objective:   BP 162/98 mmHg  Pulse 102  Ht  (1.651 m)  Wt 147 lb 8 oz (66.906 kg)  BMI 24.55 kg/m2  LMP 04/12/2015 (Exact  Date) CONSTITUTIONAL: Well-developed, well-nourished female in no acute distress.  PSYCHIATRIC: Normal mood and affect. Normal behavior. Normal judgment and thought content. NEUROLGIC: Alert and oriented to person, place, and time. Normal muscle tone coordination. No cranial nerve deficit noted. HENT:  Normocephalic, atraumatic, External right and left ear normal. Oropharynx is clear and moist EYES: Conjunctivae and EOM are normal. Pupils are equal, round, and reactive to light. No scleral  icterus.  NECK: Normal range of motion, supple, no masses.  Normal thyroid.  SKIN: Skin is warm and dry. No rash noted. Not diaphoretic. No erythema. No pallor. CARDIOVASCULAR: Normal heart rate noted, regular rhythm, no murmur. RESPIRATORY: Clear to auscultation bilaterally. Effort and breath sounds normal, no problems with respiration noted. BREASTS: Symmetric in size. No masses, skin changes, nipple drainage, or lymphadenopathy. ABDOMEN: Soft, normal bowel sounds, no distention noted.  No tenderness, rebound or guarding.  BLADDER: Normal PELVIC:  External Genitalia: Normal  BUS: Normal  Vagina: Normal  Cervix: Normal  Uterus: Normal  Adnexa: Normal  RV: {Blank multiple:19196::"External Exam NormaI","No Rectal Masses","Normal Sphincter tone"}  MUSCULOSKELETAL: Normal range of motion. No tenderness.  No cyanosis, clubbing, or edema.  2+ distal pulses. LYMPHATIC: No Axillary, Supraclavicular, or Inguinal Adenopathy.    Assessment:   Annual gynecologic examination 39 y.o. Contraception: {method:5051} {Blank multiple:19196::"Normal BMI","Overweight","Obesity 1","Obesity 2"} Problem List Items Addressed This Visit    None    Visit Diagnoses    Absence of menstruation    -  Primary    Relevant Orders    POCT urine pregnancy (Completed)    hCG, quantitative, pregnancy    Positive urine pregnancy test        Relevant Orders    hCG, quantitative, pregnancy    Yeast infection        Relevant Medications    fluconazole (DIFLUCAN) 150 MG tablet       Plan:  Pap: {Blank multiple:19196::"Pap, Reflex if ASCUS","Pap Co Test","GC/CT NAAT","Not needed","Not done"} Mammogram: {Blank multiple:19196::"Ordered","Not Ordered","Not Indicated","***"} Stool Guaiac Testing:  {Blank multiple:19196::"Ordered","Not Ordered","Not Indicated","***"} Labs: {Blank multiple:19196::"Lipid 1","FBS","TSH","Hemoglobin A1C","Vit D Level""***"} Routine preventative health maintenance measures emphasized:  {Blank multiple:19196::"Exercise/Diet/Weight control","Tobacco Warnings","Alcohol/Substance use risks","Stress Management","Peer Pressure Issues","Safe Sex"} *** Return to Clinic - 1 Year   Fenton Malling, LPN

## 2015-05-16 NOTE — Progress Notes (Signed)
Patient ID: Danielle West, female   DOB: [birth year], 39 y.o.   MRN: [id number].   CHIEF COMPLAINT:  Pt. States she had a medical abortion in 03/2015 and her f/u visit was fine. UPT was positive there but that was expected. LMP 04/12/2015 and had not had one in June but thinks it may be starting now. Home pregnancy was positive on Monday 05/13/2015. Did not use back up protection the month of June. Also c/o vaginal discharge: white, chunks and OTC medication in May but it has returned.   GYN ENCOUNTER NOTE  Subjective:       Danielle West is a 39 y.o. [phone] female is here for gynecologic evaluation of the following issues:  1. Amenorrhea. 2.  Vaginal discharge. 3.  Follow-up on recent abortion. 4.  Anxiety/stress.  As noted systole underwent therapeutic abortion recently; missed menses in June.  She now has started some mild vaginal bleeding and has pelvic cramping.  She also is complaining of vaginal discharge with itching. Patient is tearful today, voicing stressors because of her recent abortion and conflict with her husband regarding keeping or aborting the pregnancy.  Husband wanted her to keep the baby.  She is not suicidal.   Gynecologic History Patient's last menstrual period was 04/12/2015 (exact date). Contraception: condoms  Obstetric History OB History  Gravida Para Term Preterm AB SAB TAB Ectopic Multiple Living  # Outcome Date GA Lbr Len/2nd Weight Sex Delivery Anes PTL Lv  6 AB 03/2015        FD  5 SAB 03/21/11 [redacted]w[redacted]d         4 Term 12/07/06 [redacted]w[redacted]d  7 lb 6 oz (3.345 kg) M    Y  3 Term 06/17/04 [redacted]w[redacted]d  7 lb 3 oz (3.26 kg) F Vag-Spont   Y  2 Term 09/27/96 [redacted]w[redacted]d  6 lb 13 oz (3.09 kg) F Vag-Spont   Y  1 TAB               Past Medical History  Diagnosis Date  . Abnormal Pap smear   . History of colposcopy with cervical biopsy     Past Surgical History  Procedure Laterality Date  . Wisdom tooth extraction    . Finger surgery      right  middle finger  . Therapeutic abortion  03/2015    [facility] Choice via pill  . 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 Colposcopy  12/2012    cx bx-negative/s/p cryosurgery x1    Current Outpatient Prescriptions on File Prior to Visit  Medication Sig Dispense Refill  . valACYclovir (VALTREX) 1000 MG tablet Take 1 tablet (1,000 mg total) by mouth daily. (Patient taking differently: Take 1,000 mg by mouth as needed. ) 30 tablet 3   No current facility-administered medications on file prior to visit.    Allergies  Allergen Reactions  . Sulfa Antibiotics Rash    History   Social History  . Marital Status: Married    Spouse Name: N/A  . Number of Children: N/A  . Years of Education: N/A   Occupational History  . teacher    Social History Main Topics  . Smoking status: Current Some Day Smoker -- 0.30 packs/day    Types: Cigarettes  . Smokeless tobacco: Never Used  . Alcohol Use: 0.0 oz/week    0 Standard drinks or equivalent per week  Comment: ocasionally   . Drug Use: No  . Sexual Activity:    Partners: Male   Other Topics Concern  . Not on file   Social History Narrative   ** Merged History Encounter **        Family History  Problem Relation Age of Onset  . Diabetes Maternal Grandmother   . Hypertension Father   . Cancer Father     thyriod, prostate  . Diabetes Mother   . Hypertension Mother   . Cancer Mother     breast cancer  . Cancer Brother     kidney  . Diabetes Sister   . Lupus Maternal Aunt   . Cancer Paternal Grandmother     breast cancer    The following portions of the patient's history were reviewed and updated as appropriate: allergies, current medications, past family history, past medical history, past social history, past surgical history and problem list.  Review of Systems Review of Systems - General ROS: negative for - chills, fatigue, fever, hot flashes, malaise or night sweats Hematological and Lymphatic ROS: negative for -  bleeding problems or swollen lymph nodes Gastrointestinal ROS: negative for -  blood in stools, change in bowel habits and nausea/vomiting. POSITIVE-abdominal pain Musculoskeletal ROS: negative for - joint pain, muscle pain or muscular weakness Genito-Urinary ROS: negative for - , dysmenorrhea, dyspareunia, dysuria, genital discharge, genital ulcers, hematuria, incontinence, irregular/heavy menses, nocturia or pelvic pain. POSITIVE-change in menstrual cycle  Objective:   BP 162/98 mmHg  Pulse 102  Ht 5\' 5"  (1.651 m)  Wt 147 lb 8 oz (66.906 kg)  BMI 24.55 kg/m2  LMP 04/12/2015 (Exact Date) CONSTITUTIONAL: Well-developed, well-nourished female,Tearful, anxious HENT:  Normocephalic, atraumatic.  SKIN: Skin is warm and dry. No rash noted. Not diaphoretic. No erythema. No pallor. NEUROLGIC: Alert and oriented to person, place, and time. PSYCHIATRIC: Normal mood and affect. Normal behavior. Normal judgment and thought content. CARDIOVASCULAR:Not Examined RESPIRATORY: Not Examined BREASTS: Not Examined ABDOMEN: Soft, non distended; Non tender.  No Organomegaly. PELVIC:  External Genitalia: Normal  BUS: Normal  Vagina: Normal; Menstrual blood in vault  Cervix: Normal; No cervical motion tenderness  Uterus: Anteverted, approximately 8 weeks size; Normal shape,consistency, mobile, 1/4 tender  Adnexa: Normal  RV: Normal External exam  Bladder: Nontender MUSCULOSKELETAL: Normal range of motion. No tenderness.  No cyanosis, clubbing, or edema.     Assessment:   1. Absence of menstruation; History of recent therapeutic abortion  - POCT urine pregnancy - hCG, quantitative, pregnancy  2. Positive urine pregnancy test  - hCG, quantitative, pregnancy  3. Yeast infection, Likely, with inability to check because of new onset menses  - fluconazole (DIFLUCAN) 150 MG tablet; Take 1 tablet (150 mg total) by mouth once.  Dispense: 1 tablet; Refill: 0  4.  Elevated blood pressure, likely  stress-related     Plan:  1.  Diflucan 150 mg by mouth 1. 2.  Weekly quantitative hCGs until 0; if hCG is high, not declining, further assessment will be made. 3.  Start Zoloft 50 mg a day. 4.  Follow-up in 6 weeks for reassessment.

## 2015-05-18 LAB — SPECIMEN STATUS REPORT

## 2015-05-18 LAB — BETA HCG QUANT (REF LAB): HCG QUANT: 25 m[IU]/mL

## 2015-05-20 ENCOUNTER — Telehealth: Payer: Self-pay

## 2015-05-20 DIAGNOSIS — N912 Amenorrhea, unspecified: Secondary | ICD-10-CM

## 2015-05-20 NOTE — Telephone Encounter (Signed)
DONE.the patient IS ON LAB SCHED FOR 6/23

## 2015-05-20 NOTE — Telephone Encounter (Signed)
Pt aware. Beta ordered for 6/23.

## 2015-05-20 NOTE — Telephone Encounter (Signed)
-----   Message from Herold Harms, MD sent at 05/20/2015  6:33 AM EDT ----- Please notify - Abnormal Labs Quant HCG 25. Repeat weekly until negtive.

## 2015-05-23 ENCOUNTER — Other Ambulatory Visit: Payer: BC Managed Care – PPO

## 2015-07-04 ENCOUNTER — Ambulatory Visit: Payer: BC Managed Care – PPO | Admitting: Obstetrics and Gynecology

## 2016-02-25 ENCOUNTER — Encounter: Payer: Self-pay | Admitting: Family Medicine

## 2016-02-25 ENCOUNTER — Ambulatory Visit (INDEPENDENT_AMBULATORY_CARE_PROVIDER_SITE_OTHER): Payer: BC Managed Care – PPO | Admitting: Family Medicine

## 2016-02-25 VITALS — BP 140/80 | HR 79 | Temp 98.0°F | Ht 65.0 in | Wt 151.5 lb

## 2016-02-25 DIAGNOSIS — J309 Allergic rhinitis, unspecified: Secondary | ICD-10-CM | POA: Insufficient documentation

## 2016-02-25 DIAGNOSIS — J301 Allergic rhinitis due to pollen: Secondary | ICD-10-CM | POA: Diagnosis not present

## 2016-02-25 DIAGNOSIS — B351 Tinea unguium: Secondary | ICD-10-CM

## 2016-02-25 MED ORDER — CICLOPIROX 8 % EX SOLN: Freq: Every day | CUTANEOUS | Status: DC

## 2016-02-25 MED ORDER — FLUTICASONE PROPIONATE 50 MCG/ACT NA SUSP: 2.0000 | Freq: Every day | NASAL | Status: DC

## 2016-02-25 NOTE — Progress Notes (Signed)
   Subjective:    Patient ID: Danielle West, female    DOB: 1976-04-11, 40 y.o.   MRN: [id number]  HPI  40 year old female presents with new onset issue with her nails.  2 years ago after pregnancy.. Had toe itching at base of left great toenail. OTC ointment helped but it always returns She has noted  Yellowed nail in left great toenail x 2 years. Has subungual debris.  No skin changes on foot. No other toenail problems.   She has also noted headache, congestion, chills at night 1 weeks ago.   No cough, does not feel bad.  Occ sneeze, occ itchy eyes. Using sudafed, some mild improvement.  Social History /Family History/Past Medical History reviewed and updated if needed.    Review of Systems  Constitutional: Negative for fever and fatigue.  HENT: Negative for ear pain.   Eyes: Negative for pain.  Respiratory: Negative for chest tightness and shortness of breath.   Cardiovascular: Negative for chest pain, palpitations and leg swelling.  Gastrointestinal: Negative for abdominal pain.  Genitourinary: Negative for dysuria.       Objective:   Physical Exam  Constitutional: Vital signs are normal. She appears well-developed and well-nourished. She is cooperative.  Non-toxic appearance. She does not appear ill. No distress.  HENT:  Head: Normocephalic.  Right Ear: Hearing, external ear and ear canal normal. Tympanic membrane is not erythematous, not retracted and not bulging. No middle ear effusion.  Left Ear: Hearing, tympanic membrane, external ear and ear canal normal. Tympanic membrane is not erythematous, not retracted and not bulging.  No middle ear effusion.  Nose: Mucosal edema and rhinorrhea present. Right sinus exhibits no maxillary sinus tenderness and no frontal sinus tenderness. Left sinus exhibits no maxillary sinus tenderness and no frontal sinus tenderness.  Mouth/Throat: Uvula is midline, oropharynx is clear and moist and mucous membranes are normal. No  oropharyngeal exudate, posterior oropharyngeal edema or posterior oropharyngeal erythema.  Eyes: Conjunctivae, EOM and lids are normal. Pupils are equal, round, and reactive to light. Lids are everted and swept, no foreign bodies found.  Neck: Trachea normal and normal range of motion. Neck supple. Carotid bruit is not present. No thyroid mass and no thyromegaly present.  Cardiovascular: Normal rate, regular rhythm, S1 normal, S2 normal, normal heart sounds, intact distal pulses and normal pulses.  Exam reveals no gallop and no friction rub.   No murmur heard. Pulmonary/Chest: Effort normal and breath sounds normal. No tachypnea. No respiratory distress. She has no decreased breath sounds. She has no wheezes. She has no rhonchi. She has no rales.  Neurological: She is alert.  Skin: Skin is warm, dry and intact. No rash noted.  Left great toenail discolored and thickened, mild subungual debris, no rash on foot  Psychiatric: Her speech is normal and behavior is normal. Judgment normal. Her mood appears not anxious. Cognition and memory are normal. She does not exhibit a depressed mood.          Assessment & Plan:

## 2016-02-25 NOTE — Patient Instructions (Addendum)
Start penlac laquer daily. Start flonase 2 sprays per nostril daily. Nasal saline spray or irrigation. Consider Claritin daily.

## 2016-02-25 NOTE — Assessment & Plan Note (Signed)
Discussed options. Pt chose penlac topical  To treat x 1 year. Also spray shoes with antifungal spray.

## 2016-02-25 NOTE — Progress Notes (Signed)
Pre visit review using our clinic review tool, if applicable. No additional management support is needed unless otherwise documented below in the visit note. 

## 2016-04-29 ENCOUNTER — Other Ambulatory Visit: Payer: Self-pay | Admitting: Family Medicine

## 2016-04-29 NOTE — Telephone Encounter (Signed)
Last office visit 02/25/2016 with Dr. Ermalene SearingBedsole.  Last filled 02/25/2016 for 6.6 ml with no refills. Ok to refill?

## 2016-05-14 ENCOUNTER — Encounter: Payer: Self-pay | Admitting: Family Medicine

## 2016-05-14 ENCOUNTER — Telehealth: Payer: Self-pay | Admitting: Family Medicine

## 2016-05-14 ENCOUNTER — Ambulatory Visit (INDEPENDENT_AMBULATORY_CARE_PROVIDER_SITE_OTHER): Payer: BC Managed Care – PPO | Admitting: Family Medicine

## 2016-05-14 VITALS — BP 142/88 | HR 91 | Temp 97.9°F | Ht 65.0 in | Wt 153.0 lb

## 2016-05-14 DIAGNOSIS — J0140 Acute pansinusitis, unspecified: Secondary | ICD-10-CM

## 2016-05-14 DIAGNOSIS — H6692 Otitis media, unspecified, left ear: Secondary | ICD-10-CM

## 2016-05-14 MED ORDER — FLUCONAZOLE 150 MG PO TABS: 150.0000 mg | ORAL_TABLET | Freq: Once | ORAL | Status: DC

## 2016-05-14 MED ORDER — AMOXICILLIN-POT CLAVULANATE 875-125 MG PO TABS
1.0000 | ORAL_TABLET | Freq: Two times a day (BID) | ORAL | Status: DC
Start: 2016-05-14 — End: 2019-02-15

## 2016-05-14 NOTE — Patient Instructions (Addendum)
Please take augmentin as directed with food.  Return to clinic for further evaluation if symptoms do not improve with treatment in 3-4 days, worsen, or you develop a fever >101.   Sinusitis, Adult Sinusitis is redness, soreness, and inflammation of the paranasal sinuses. Paranasal sinuses are air pockets within the bones of your face. They are located beneath your eyes, in the middle of your forehead, and above your eyes. In healthy paranasal sinuses, mucus is able to drain out, and air is able to circulate through them by way of your nose. However, when your paranasal sinuses are inflamed, mucus and air can become trapped. This can allow bacteria and other germs to grow and cause infection. Sinusitis can develop quickly and last only a short time (acute) or continue over a long period (chronic). Sinusitis that lasts for more than 12 weeks is considered chronic. CAUSES Causes of sinusitis include:  Allergies.  Structural abnormalities, such as displacement of the cartilage that separates your nostrils (deviated septum), which can decrease the air flow through your nose and sinuses and affect sinus drainage.  Functional abnormalities, such as when the small hairs (cilia) that line your sinuses and help remove mucus do not work properly or are not present. SIGNS AND SYMPTOMS Symptoms of acute and chronic sinusitis are the same. The primary symptoms are pain and pressure around the affected sinuses. Other symptoms include:  Upper toothache.  Earache.  Headache.  Bad breath.  Decreased sense of smell and taste.  A cough, which worsens when you are lying flat.  Fatigue.  Fever.  Thick drainage from your nose, which often is green and may contain pus (purulent).  Swelling and warmth over the affected sinuses. DIAGNOSIS Your health care provider will perform a physical exam. During your exam, your health care provider may perform any of the following to help determine if you have acute  sinusitis or chronic sinusitis:  Look in your nose for signs of abnormal growths in your nostrils (nasal polyps).  Tap over the affected sinus to check for signs of infection.  View the inside of your sinuses using an imaging device that has a light attached (endoscope). If your health care provider suspects that you have chronic sinusitis, one or more of the following tests may be recommended:  Allergy tests.  Nasal culture. A sample of mucus is taken from your nose, sent to a lab, and screened for bacteria.  Nasal cytology. A sample of mucus is taken from your nose and examined by your health care provider to determine if your sinusitis is related to an allergy. TREATMENT Most cases of acute sinusitis are related to a viral infection and will resolve on their own within 10 days. Sometimes, medicines are prescribed to help relieve symptoms of both acute and chronic sinusitis. These may include pain medicines, decongestants, nasal steroid sprays, or saline sprays. However, for sinusitis related to a bacterial infection, your health care provider will prescribe antibiotic medicines. These are medicines that will help kill the bacteria causing the infection. Rarely, sinusitis is caused by a fungal infection. In these cases, your health care provider will prescribe antifungal medicine. For some cases of chronic sinusitis, surgery is needed. Generally, these are cases in which sinusitis recurs more than 3 times per year, despite other treatments. HOME CARE INSTRUCTIONS  Drink plenty of water. Water helps thin the mucus so your sinuses can drain more easily.  Use a humidifier.  Inhale steam 3-4 times a day (for example, sit in the  bathroom with the shower running).  Apply a warm, moist washcloth to your face 3-4 times a day, or as directed by your health care provider.  Use saline nasal sprays to help moisten and clean your sinuses.  Take medicines only as directed by your health care  provider.  If you were prescribed either an antibiotic or antifungal medicine, finish it all even if you start to feel better. SEEK IMMEDIATE MEDICAL CARE IF:  You have increasing pain or severe headaches.  You have nausea, vomiting, or drowsiness.  You have swelling around your face.  You have vision problems.  You have a stiff neck.  You have difficulty breathing.   This information is not intended to replace advice given to you by your health care provider. Make sure you discuss any questions you have with your health care provider.   Document Released: 11/16/2005 Document Revised: 12/07/2014 Document Reviewed: 12/01/2011 Elsevier Interactive Patient Education [employer]2016 [employer]

## 2016-05-14 NOTE — Telephone Encounter (Signed)
Pt has appt 05/14/16 at 3:30 with Delbert HarnessJulie Kordsmeier FNP.

## 2016-05-14 NOTE — Progress Notes (Signed)
Subjective:    Patient ID: Danielle West, female    DOB: 1976-10-17, 40 y.o.   MRN: [id number]  HPI  Ms. Laural West is a 40 year old female who presents acutely ill lying on exam table today with sneezing and congestion for 8 days. Associated symptoms of chills, ear pain, rhinitis with yellow drainage, frontal sinus pressure/pain, loss of sense of smell and taste, and cough that is nonproductive. Denies N/V/D or history of asthma/bronchitis.  Recent sick exposure of husband and 38 year old daughter.  Treatment at home includespseudoephedrine, tylenol, and tylenol cold/sinus with minimal benefit.  No recent antibiotic use. No aggravating or alleviating factors noted and symptoms are not improving.  Retake of BP is noted as 142/88. Patient reports taking pseudoephedrine. She monitors her BP at home and she reports systolic averages in the 130s and diastolic in the 80s.  Today, she denies any chest pain, palpitations, SOB, headaches, nosebleeds, numbness, and tingling.  Review of Systems  Constitutional: Positive for chills. Negative for fever.  HENT: Positive for ear pain, postnasal drip, rhinorrhea, sinus pressure and sneezing.        Loss of smell and taste  Respiratory: Positive for cough. Negative for shortness of breath.   Cardiovascular: Negative for chest pain, palpitations and leg swelling.  Gastrointestinal: Negative for nausea, vomiting, abdominal pain and diarrhea.  Musculoskeletal: Negative for myalgias.  Skin: Negative for rash.  Neurological: Negative for dizziness, light-headedness and headaches.   Past Medical History  Diagnosis Date  . Abnormal Pap smear   . History of colposcopy with cervical biopsy      Social History   Social History  . Marital Status: Married    Spouse Name: Danielle West  . Number of Children: Danielle West  . Years of Education: Danielle West   Occupational History  . teacher    Social History Main Topics  . Smoking status: Former Smoker -- 0.30 packs/day    Types:  Cigarettes  . Smokeless tobacco: Never Used  . Alcohol Use: 0.0 oz/week    0 Standard drinks or equivalent per week     Comment: ocasionally   . Drug Use: No  . Sexual Activity:    Partners: Male   Other Topics Concern  . Not on file   Social History Narrative   ** Merged History Encounter **        Past Surgical History  Procedure Laterality Date  . Wisdom tooth extraction    . Finger surgery      right middle finger  . [facility]  . 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 Colposcopy  12/2012    cx bx-negative/s/p cryosurgery x1    Family History  Problem Relation Age of Onset  . Diabetes Maternal Grandmother   . Hypertension Father   . Cancer Father     thyriod, prostate  . Diabetes Mother   . Hypertension Mother   . Cancer Mother     breast cancer  . Cancer Brother     kidney  . Diabetes Sister   . Lupus Maternal Aunt   . Cancer Paternal Grandmother     breast cancer    Allergies  Allergen Reactions  . Sulfa Antibiotics Rash    Current Outpatient Prescriptions on File Prior to Visit  Medication Sig Dispense Refill  . ciclopirox (PENLAC) 8 % solution APPLY TOPICALLY AT BEDTIME OVER NAIL AND SURROUNDING SKIN FOR 1 WEEK, THEN  REMOVE AND CONTINUE CYCLE 6.6 mL 0  . fluticasone (FLONASE) 50 MCG/ACT nasal spray Place 2 sprays into both nostrils daily. 16 g 6  . valACYclovir (VALTREX) 1000 MG tablet Take 1 tablet (1,000 mg total) by mouth daily. (Patient taking differently: Take 1,000 mg by mouth as needed. ) 30 tablet 3   No current facility-administered medications on file prior to visit.    BP 142/88 mmHg  Pulse 91  Temp(Src) 97.9 F (36.6 C) (Oral)  Ht  (1.651 m)  Wt 153 lb (69.4 kg)  BMI 25.46 kg/m2  SpO2 99%       Objective:   Physical Exam  Constitutional: She is oriented to person, place, and time. She appears well-developed and well-nourished.  HENT:  Right Ear: Tympanic membrane normal.    Left Ear: Tympanic membrane is erythematous.  Nose: Rhinorrhea present. Right sinus exhibits maxillary sinus tenderness and frontal sinus tenderness. Left sinus exhibits maxillary sinus tenderness and frontal sinus tenderness.  Mouth/Throat: Mucous membranes are normal. No oropharyngeal exudate or posterior oropharyngeal erythema.  Erythematous nasal membranes  Eyes: Pupils are equal, round, and reactive to light. No scleral icterus.  Neck: Neck supple.  Cardiovascular: Normal rate and regular rhythm.  Exam reveals no gallop and no friction rub.   No murmur heard. Pulmonary/Chest: Effort normal and breath sounds normal. She has no wheezes. She has no rales.  Abdominal: Soft. Bowel sounds are normal. There is no tenderness.  Lymphadenopathy:    She has cervical adenopathy.  Neurological: She is alert and oriented to person, place, and time.  Skin: Skin is warm and dry. No rash noted.  Psychiatric: She has a normal mood and affect. Her behavior is normal. Judgment and thought content normal.       Assessment & Plan:  1. Acute pansinusitis, recurrence not specified Exam and history with acutely ill appearance, recent sick contact with 34 year old daughter in daycare, and loss of smell and taste support treatment for sinusitis today.   - amoxicillin-clavulanate (AUGMENTIN) 875-125 MG tablet; Take 1 tablet by mouth 2 (two) times daily.  Dispense: 20 tablet; Refill: 0  2. Acute left otitis media, recurrence not specified, unspecified otitis media type Augmentin treatment for sinusitis and OM.  Further advised patient regarding the use of pseudoephedrine and how this medication can raise BP and heart rate. Discussed use of nasal saline rinse for congestion and sinus symptoms during antibiotic therapy. Also, advised patient on supportive measures:  Get rest, drink plenty of fluids, and use tylenol for discomfort. Follow up if fever >101, if symptoms worsen or if symptoms are not improved in 3 to  4  days. Patient verbalizes understanding and agreed with plan.  Diflucan provided for history of post antibiotic candidiasis.  Roddie Mc, FNP-C

## 2016-05-14 NOTE — Telephone Encounter (Signed)
Patient Name: Danielle West  DOB: 09-22-1976    Initial Comment Caller states, getting over a cold, but now she can not smell or taste. She started Tylenol Sinus / cold yesterday.    Nurse Assessment  Nurse: Sherilyn CooterHenry, RN, Thurmond ButtsWade Date/Time Lamount Cohen(Eastern Time): 05/14/2016 11:49:15 AM  Confirm and document reason for call. If symptomatic, describe symptoms. You must click the next button to save text entered. ---Caller states that she has had cold symptoms since Sunday. On Monday, she began to lose her sense of smell and taste and she still has it today. She has sinus congestion. She has been using Flonase in her nose and began taking Tylenol Sinus/Cold yesterday. She is not a diabetic, denies HTN, and she doesn't smoke. Information given from website. These were listed as possible causes. She does have allergies also, but cold symptoms are going through the house. https://familydoctor.org/condition/sensory-dysfunction/?adfree=true  Has the patient traveled out of the country within the last 30 days? ---No  Does the patient have any new or worsening symptoms? ---Yes  Will a triage be completed? ---Yes  Related visit to physician within the last 2 weeks? ---No  Does the PT have any chronic conditions? (i.e. diabetes, asthma, etc.) ---No  Is the patient pregnant or possibly pregnant? (Ask all females between the ages of 2612-55) ---No  Is this a behavioral health or substance abuse call? ---No     Guidelines    Guideline Title Affirmed Question Affirmed Notes       Final Disposition User        Comments  I was not able to find a guideline to go with these symptoms of loss of taste and smell. The website I was in indicated that treatment can be helpful. I am recommending she see the doctor within a day or two. She verbalized understanding.  Dr. Patsy Lageropland did not have any appointment available, nor [facility]. She requested to be seen today. I checked [facility] and was  able to schedule her to see Delbert HarnessJulie Kordsmeier NP at 3:30pm today.   Referrals  REFERRED TO PCP OFFICE

## 2016-08-25 ENCOUNTER — Other Ambulatory Visit: Payer: Self-pay | Admitting: Family Medicine

## 2019-02-14 ENCOUNTER — Emergency Department: Payer: BC Managed Care – PPO | Admitting: Anesthesiology

## 2019-02-14 ENCOUNTER — Emergency Department: Payer: BC Managed Care – PPO

## 2019-02-14 ENCOUNTER — Encounter
Admission: EM | Disposition: A | Discharge status: Home / Self Care | Payer: Self-pay | Source: Home / Self Care | Attending: Emergency Medicine

## 2019-02-14 ENCOUNTER — Observation Stay
Admission: EM | Admit: 2019-02-14 | Discharge: 2019-02-15 | Disposition: A | Discharge status: Home / Self Care | Payer: BC Managed Care – PPO | Attending: Obstetrics and Gynecology | Admitting: Obstetrics and Gynecology

## 2019-02-14 ENCOUNTER — Other Ambulatory Visit: Payer: Self-pay

## 2019-02-14 ENCOUNTER — Encounter: Payer: Self-pay | Admitting: Emergency Medicine

## 2019-02-14 DIAGNOSIS — O021 Missed abortion: Secondary | ICD-10-CM | POA: Diagnosis not present

## 2019-02-14 DIAGNOSIS — Z87891 Personal history of nicotine dependence: Secondary | ICD-10-CM | POA: Diagnosis not present

## 2019-02-14 DIAGNOSIS — Z7951 Long term (current) use of inhaled steroids: Secondary | ICD-10-CM | POA: Diagnosis not present

## 2019-02-14 DIAGNOSIS — Z302 Encounter for sterilization: Secondary | ICD-10-CM | POA: Diagnosis not present

## 2019-02-14 DIAGNOSIS — N838 Other noninflammatory disorders of ovary, fallopian tube and broad ligament: Secondary | ICD-10-CM | POA: Insufficient documentation

## 2019-02-14 DIAGNOSIS — Z79899 Other long term (current) drug therapy: Secondary | ICD-10-CM | POA: Diagnosis not present

## 2019-02-14 DIAGNOSIS — J309 Allergic rhinitis, unspecified: Secondary | ICD-10-CM | POA: Insufficient documentation

## 2019-02-14 DIAGNOSIS — O3680X0 Pregnancy with inconclusive fetal viability, not applicable or unspecified: Secondary | ICD-10-CM

## 2019-02-14 HISTORY — PX: DILATION AND EVACUATION: SHX1459

## 2019-02-14 HISTORY — PX: LAPAROSCOPY: SHX197

## 2019-02-14 HISTORY — PX: TUBAL LIGATION: SHX77

## 2019-02-14 LAB — CBC WITH DIFFERENTIAL/PLATELET
Abs Immature Granulocytes: 0.09 10*3/uL — ABNORMAL HIGH (ref 0.00–0.07)
Basophils Absolute: 0.1 10*3/uL (ref 0.0–0.1)
Basophils Relative: 0 %
EOS PCT: 0 %
Eosinophils Absolute: 0 10*3/uL (ref 0.0–0.5)
HCT: 37.6 % (ref 36.0–46.0)
Hemoglobin: 12.9 g/dL (ref 12.0–15.0)
Immature Granulocytes: 1 %
Lymphocytes Relative: 12 %
Lymphs Abs: 1.9 10*3/uL (ref 0.7–4.0)
MCH: 30.4 pg (ref 26.0–34.0)
MCHC: 34.3 g/dL (ref 30.0–36.0)
MCV: 88.7 fL (ref 80.0–100.0)
Monocytes Absolute: 0.9 10*3/uL (ref 0.1–1.0)
Monocytes Relative: 6 %
Neutro Abs: 12.7 10*3/uL — ABNORMAL HIGH (ref 1.7–7.7)
Neutrophils Relative %: 81 %
Platelets: 588 10*3/uL — ABNORMAL HIGH (ref 150–400)
RBC: 4.24 MIL/uL (ref 3.87–5.11)
RDW: 12.1 % (ref 11.5–15.5)
WBC: 15.6 10*3/uL — ABNORMAL HIGH (ref 4.0–10.5)
nRBC: 0 % (ref 0.0–0.2)

## 2019-02-14 LAB — COMPREHENSIVE METABOLIC PANEL
ALT: 14 U/L (ref 0–44)
AST: 19 U/L (ref 15–41)
Albumin: 4.6 g/dL (ref 3.5–5.0)
Alkaline Phosphatase: 55 U/L (ref 38–126)
Anion gap: 10 (ref 5–15)
BILIRUBIN TOTAL: 0.4 mg/dL (ref 0.3–1.2)
BUN: 6 mg/dL (ref 6–20)
CO2: 21 mmol/L — ABNORMAL LOW (ref 22–32)
Calcium: 9 mg/dL (ref 8.9–10.3)
Chloride: 103 mmol/L (ref 98–111)
Creatinine, Ser: 0.58 mg/dL (ref 0.44–1.00)
GFR calc Af Amer: >60 mL/min (ref >60)
GFR calc non Af Amer: >60 mL/min (ref >60)
Glucose, Bld: 106 mg/dL — ABNORMAL HIGH (ref 70–99)
Potassium: 3.6 mmol/L (ref 3.5–5.1)
Sodium: 134 mmol/L — ABNORMAL LOW (ref 135–145)
TOTAL PROTEIN: 8.2 g/dL — AB (ref 6.5–8.1)

## 2019-02-14 LAB — HCG, QUANTITATIVE, PREGNANCY: hCG, Beta Chain, Quant, S: 33341 m[IU]/mL — ABNORMAL HIGH (ref <5)

## 2019-02-14 SURGERY — DILATION AND EVACUATION, UTERUS
Anesthesia: General | Site: Vagina

## 2019-02-14 MED ORDER — LACTATED RINGERS IV SOLN
INTRAVENOUS | Status: DC
  Administered 2019-02-14: 21:00:00 via INTRAVENOUS

## 2019-02-14 MED ORDER — DOXYCYCLINE HYCLATE 100 MG PO TABS
200.0000 mg | ORAL_TABLET | Freq: Once | ORAL | Status: AC
  Administered 2019-02-14: 200 mg via ORAL
  Filled 2019-02-14: qty 2

## 2019-02-14 MED ORDER — ACETAMINOPHEN 500 MG PO TABS: 1000.0000 mg | ORAL_TABLET | Freq: Three times a day (TID) | ORAL | 0 refills | Status: AC

## 2019-02-14 MED ORDER — FENTANYL CITRATE (PF) 100 MCG/2ML IJ SOLN
INTRAMUSCULAR | Status: DC | PRN
  Administered 2019-02-14 (×2): 50 ug via INTRAVENOUS
  Administered 2019-02-14: 100 ug via INTRAVENOUS

## 2019-02-14 MED ORDER — FENTANYL CITRATE (PF) 100 MCG/2ML IJ SOLN
INTRAMUSCULAR | Status: AC
  Administered 2019-02-14: 25 ug via INTRAVENOUS
  Filled 2019-02-14: qty 2

## 2019-02-14 MED ORDER — PHENYLEPHRINE HCL 10 MG/ML IJ SOLN
INTRAMUSCULAR | Status: DC | PRN
  Administered 2019-02-14: 100 ug via INTRAVENOUS

## 2019-02-14 MED ORDER — DOCUSATE SODIUM 100 MG PO CAPS: 100.0000 mg | ORAL_CAPSULE | Freq: Two times a day (BID) | ORAL | 0 refills | Status: DC

## 2019-02-14 MED ORDER — KETOROLAC TROMETHAMINE 30 MG/ML IJ SOLN
30.0000 mg | Freq: Once | INTRAMUSCULAR | Status: AC
  Administered 2019-02-14: 30 mg via INTRAVENOUS

## 2019-02-14 MED ORDER — DEXAMETHASONE SODIUM PHOSPHATE 10 MG/ML IJ SOLN
INTRAMUSCULAR | Status: DC | PRN
  Administered 2019-02-14: 10 mg via INTRAVENOUS

## 2019-02-14 MED ORDER — ROCURONIUM BROMIDE 100 MG/10ML IV SOLN
INTRAVENOUS | Status: DC | PRN
  Administered 2019-02-14: 50 mg via INTRAVENOUS

## 2019-02-14 MED ORDER — MIDAZOLAM HCL 2 MG/2ML IJ SOLN
INTRAMUSCULAR | Status: DC | PRN
  Administered 2019-02-14: 2 mg via INTRAVENOUS

## 2019-02-14 MED ORDER — ONDANSETRON HCL 4 MG/2ML IJ SOLN: 4.0000 mg | Freq: Once | INTRAMUSCULAR | Status: DC | PRN

## 2019-02-14 MED ORDER — CELECOXIB 200 MG PO CAPS
400.0000 mg | ORAL_CAPSULE | ORAL | Status: AC
  Administered 2019-02-14: 400 mg via ORAL
  Filled 2019-02-14: qty 2

## 2019-02-14 MED ORDER — DOXYCYCLINE HYCLATE 100 MG PO TABS
100.0000 mg | ORAL_TABLET | Freq: Once | ORAL | Status: AC
  Administered 2019-02-14: 100 mg via ORAL
  Filled 2019-02-14: qty 1

## 2019-02-14 MED ORDER — IBUPROFEN 800 MG PO TABS: 800.0000 mg | ORAL_TABLET | Freq: Three times a day (TID) | ORAL | 1 refills | Status: AC

## 2019-02-14 MED ORDER — GABAPENTIN 800 MG PO TABS: 800.0000 mg | ORAL_TABLET | Freq: Every day | ORAL | 0 refills | Status: DC

## 2019-02-14 MED ORDER — SUGAMMADEX SODIUM 200 MG/2ML IV SOLN
INTRAVENOUS | Status: DC | PRN
  Administered 2019-02-14: 140 mg via INTRAVENOUS

## 2019-02-14 MED ORDER — GABAPENTIN 300 MG PO CAPS
300.0000 mg | ORAL_CAPSULE | ORAL | Status: AC
  Administered 2019-02-14: 300 mg via ORAL

## 2019-02-14 MED ORDER — EPHEDRINE SULFATE 50 MG/ML IJ SOLN
INTRAMUSCULAR | Status: DC | PRN
  Administered 2019-02-14: 10 mg via INTRAVENOUS

## 2019-02-14 MED ORDER — ACETAMINOPHEN 325 MG PO TABS
650.0000 mg | ORAL_TABLET | Freq: Once | ORAL | Status: AC
  Administered 2019-02-14: 650 mg via ORAL
  Filled 2019-02-14: qty 2

## 2019-02-14 MED ORDER — FENTANYL CITRATE (PF) 100 MCG/2ML IJ SOLN
25.0000 ug | INTRAMUSCULAR | Status: DC | PRN
  Administered 2019-02-14 (×4): 25 ug via INTRAVENOUS

## 2019-02-14 MED ORDER — ACETAMINOPHEN 500 MG PO TABS
1000.0000 mg | ORAL_TABLET | ORAL | Status: AC
  Administered 2019-02-14: 1000 mg via ORAL

## 2019-02-14 MED ORDER — ONDANSETRON HCL 4 MG/2ML IJ SOLN
INTRAMUSCULAR | Status: DC | PRN
  Administered 2019-02-14: 4 mg via INTRAVENOUS

## 2019-02-14 MED ORDER — BUPIVACAINE HCL 0.5 % IJ SOLN
INTRAMUSCULAR | Status: DC | PRN
  Administered 2019-02-14: 8 mL

## 2019-02-14 MED ORDER — KETOROLAC TROMETHAMINE 30 MG/ML IJ SOLN
INTRAMUSCULAR | Status: AC
  Administered 2019-02-14: 30 mg via INTRAVENOUS
  Filled 2019-02-14: qty 1

## 2019-02-14 MED ORDER — PROPOFOL 10 MG/ML IV BOLUS
INTRAVENOUS | Status: DC | PRN
  Administered 2019-02-14: 160 mg via INTRAVENOUS

## 2019-02-14 MED ORDER — HYDROCODONE-ACETAMINOPHEN 5-325 MG PO TABS: 1.0000 | ORAL_TABLET | Freq: Four times a day (QID) | ORAL | 0 refills | Status: DC | PRN

## 2019-02-14 SURGICAL SUPPLY — 62 items
ADH SKN CLS APL DERMABOND .7 (GAUZE/BANDAGES/DRESSINGS) ×3
APL PRP STRL LF DISP 70% ISPRP (MISCELLANEOUS) ×3
BAG SPEC RTRVL LRG 6X4 10 (ENDOMECHANICALS)
BAG URINE DRAINAGE (UROLOGICAL SUPPLIES) ×4 IMPLANT
BLADE SURG SZ11 CARB STEEL (BLADE) ×4 IMPLANT
CATH FOLEY 2WAY  5CC 16FR (CATHETERS) ×1
CATH FOLEY 2WAY 5CC 16FR (CATHETERS) ×3
CATH ROBINSON RED A/P 16FR (CATHETERS) ×4 IMPLANT
CATH URTH 16FR FL 2W BLN LF (CATHETERS) ×3 IMPLANT
CHLORAPREP W/TINT 26 (MISCELLANEOUS) ×4 IMPLANT
COVER WAND RF STERILE (DRAPES) ×4 IMPLANT
DERMABOND ADVANCED (GAUZE/BANDAGES/DRESSINGS) ×1
DERMABOND ADVANCED .7 DNX12 (GAUZE/BANDAGES/DRESSINGS) ×3 IMPLANT
DRAPE GENERAL ENDO 106X123.5 (DRAPES) ×4 IMPLANT
DRAPE LEGGINS SURG 28X43 STRL (DRAPES) ×4 IMPLANT
DRAPE UNDER BUTTOCK W/FLU (DRAPES) ×4 IMPLANT
FILTER UTR ASPR SPEC (MISCELLANEOUS) ×3 IMPLANT
FLTR UTR ASPR SPEC (MISCELLANEOUS) ×4
GLOVE BIO SURGEON STRL SZ7 (GLOVE) ×10 IMPLANT
GLOVE INDICATOR 7.5 STRL GRN (GLOVE) ×5 IMPLANT
GOWN STRL REUS W/ TWL LRG LVL3 (GOWN DISPOSABLE) ×6 IMPLANT
GOWN STRL REUS W/TWL LRG LVL3 (GOWN DISPOSABLE) ×8
IRRIGATION STRYKERFLOW (MISCELLANEOUS) ×3 IMPLANT
IRRIGATOR STRYKERFLOW (MISCELLANEOUS)
IV NS 1000ML (IV SOLUTION) ×4
IV NS 1000ML BAXH (IV SOLUTION) ×3 IMPLANT
KIT BERKELEY 1ST TRIMESTER 3/8 (MISCELLANEOUS) ×4 IMPLANT
KIT PINK PAD W/HEAD ARE REST (MISCELLANEOUS) ×4
KIT PINK PAD W/HEAD ARM REST (MISCELLANEOUS) ×3 IMPLANT
KIT TURNOVER CYSTO (KITS) ×4 IMPLANT
LABEL OR SOLS (LABEL) ×4 IMPLANT
LIGASURE LAP MARYLAND 5MM 37CM (ELECTROSURGICAL) ×3 IMPLANT
LIGASURE VESSEL 5MM BLUNT TIP (ELECTROSURGICAL) ×1 IMPLANT
NDL FILTER BLUNT 18X1 1/2 (NEEDLE) ×3 IMPLANT
NEEDLE FILTER BLUNT 18X 1/2SAF (NEEDLE) ×1
NEEDLE FILTER BLUNT 18X1 1/2 (NEEDLE) ×3 IMPLANT
NS IRRIG 500ML POUR BTL (IV SOLUTION) ×4 IMPLANT
PACK DNC HYST (MISCELLANEOUS) ×4 IMPLANT
PACK GYN LAPAROSCOPIC (MISCELLANEOUS) ×4 IMPLANT
PAD OB MATERNITY 4.3X12.25 (PERSONAL CARE ITEMS) ×4 IMPLANT
PAD PREP 24X41 OB/GYN DISP (PERSONAL CARE ITEMS) ×4 IMPLANT
POUCH SPECIMEN RETRIEVAL 10MM (ENDOMECHANICALS) IMPLANT
SCISSORS METZENBAUM CVD 33 (INSTRUMENTS) IMPLANT
SET BERKELEY SUCTION TUBING (SUCTIONS) ×4 IMPLANT
SET TUBE SMOKE EVAC HIGH FLOW (TUBING) ×4 IMPLANT
SLEEVE ENDOPATH XCEL 5M (ENDOMECHANICALS) ×4 IMPLANT
STRIP CLOSURE SKIN 1/4X4 (GAUZE/BANDAGES/DRESSINGS) ×4 IMPLANT
SUT MNCRL AB 4-0 PS2 18 (SUTURE) ×3 IMPLANT
SUT VIC AB 2-0 UR6 27 (SUTURE) ×3 IMPLANT
SUT VIC AB 4-0 SH 27 (SUTURE)
SUT VIC AB 4-0 SH 27XANBCTRL (SUTURE) ×3 IMPLANT
SYR 50ML LL SCALE MARK (SYRINGE) IMPLANT
SYR 5ML LL (SYRINGE) ×4 IMPLANT
TOWEL OR 17X26 4PK STRL BLUE (TOWEL DISPOSABLE) ×4 IMPLANT
TROCAR XCEL NON-BLD 5MMX100MML (ENDOMECHANICALS) ×4 IMPLANT
TUBING ART PRESS 48 MALE/FEM (TUBING) IMPLANT
VACURETTE 10 RIGID CVD (CANNULA) IMPLANT
VACURETTE 6 ASPIR F TIP BERK (CANNULA) IMPLANT
VACURETTE 7MM F TIP (CANNULA) ×1
VACURETTE 7MM F TIP STRL (CANNULA) IMPLANT
VACURETTE 8 RIGID CVD (CANNULA) IMPLANT
VACURETTE 8MM F TIP (MISCELLANEOUS) ×3 IMPLANT

## 2019-02-14 NOTE — ED Triage Notes (Signed)
Right lowe quad pain.

## 2019-02-14 NOTE — Op Note (Addendum)
Operative Report Suction Dilation and Curettage, diagnostic lap, lap BTL   Indications: Pregnancy of unknown location, undesired fertility   Pre-operative Diagnosis: Pregnancy of unknown location at approx 7 weeks  Post-operative Diagnosis: Missed abortion  Procedure: 1. Suction D&C 2. Diagnostic laparoscopy 3. Bilateral tubal ligation  Surgeon: Christeen Douglas, MD  Assistant(s):  CST  Anesthesia: General LMA anesthesia  Anesthesiologist: Naomie Dean, MD Anesthesiologist: Naomie Dean, MD CRNA: Waldo Laine, CRNA  Estimated Blood Loss:  Minimal         Intraoperative medications: none         Total IV Fluids:  Urine Output:         Specimens: Portion of right and left tube         Complications:  None; patient tolerated the procedure well.         Disposition: PACU - hemodynamically stable.         Condition: stable  Findings: Uterus measuring 7 weeks; normal cervix, vagina, perineum. Normal bilateral tubes and ovaries. Normal upper abdomen. Normal appendix. No hemoperitoneum.  Indication for procedure/Consents: 43 y.o. [id number]  here for urgent surgery after arrival to the ER with RLQ pain and pregnancy of unknown location. She requested sterilization prior to arrival to the OR. Initially she had asked for a tubal if ectopic found, but after reflection asked for permanent sterilization regardless of where the pregnancy is. She understands that this is permanent and requests of her own free will.   Risks of surgery were discussed with the patient including but not limited to: bleeding which may require transfusion; infection which may require antibiotics; injury to uterus or surrounding organs; intrauterine scarring; need for additional procedures including laparotomy; and other postoperative/anesthesia complications. Written informed consent was obtained.    Procedure Details:   The patient received oral antibiotics while in the preoperative  area.  She was then taken to the operating room where general anesthesia was administered and was found to be adequate.  After a formal and adequate timeout was performed, she was placed in the dorsal lithotomy position and examined with the above findings. She was then prepped and draped in the sterile manner.   Her bladder was catheterized for an estimated amount of clear, yellow urine. A speculum was then placed in the patient's vagina and a single tooth tenaculum was applied to the anterior lip of the cervix.    No uterine sounding was performed on this pregnant uterus. Her cervix was serially dilated to accommodate a 7 sized flexible suction curette.  A sharp curettage was then performed until there was a gritty texture in all four quadrants.  The tenaculum was removed from the anterior lip of the cervix and the vaginal speculum was removed after noting good hemostasis. Sponge stick placed. Foley to drainage placed.  Attention was turned to the abdomen where an umbilical incision was made with the scalpel.  The Optiview 5-mm trocar and sleeve were then advanced without difficulty with the laparoscope under direct visualization into the abdomen.  The abdomen was then insufflated with carbon dioxide gas and adequate pneumoperitoneum was obtained.   A detailed survey of the patient's pelvis and abdomen revealed the findings as mentioned above. One additional 76mm trocar was placed in the left lower quadrant under direct visualization.  The pelvis was evaluated. Findings as above.  Bilateral tubes removed with the fimbriated ends using ligasure.  The operative site was surveyed, and it was found to be hemostatic.  No intraoperative  injury to surrounding organs was noted.   Pictures were taken of the quadrants and pelvis. The abdomen was desufflated and all instruments were then removed from the patient's abdomen.  All incisions were closed with 4-0 Vicryl and Dermabond.   Sponge stick and Foley  removed.  The patient tolerated the procedure well and was taken to the recovery area awake, extubated and in stable condition.  The patient will be discharged to home as per PACU criteria, or overnight monitoring given the late hour.  She will receive another dose of oral antibiotics prior to discharge. Routine postoperative instructions given.  She was prescribed Percocet, Ibuprofen and Colace.  She will follow up in the clinic in two weeks for postoperative evaluation.

## 2019-02-14 NOTE — Anesthesia Preprocedure Evaluation (Addendum)
Anesthesia Evaluation  Patient identified by MRN, date of birth, ID band Patient awake    Reviewed: Allergy & Precautions, NPO status , Patient's Chart, lab work & pertinent test results  History of Anesthesia Complications Negative for: history of anesthetic complications  Airway Mallampati: II       Dental   Pulmonary neg sleep apnea, neg COPD, former smoker,           Cardiovascular (-) hypertension(-) Past MI and (-) CHF (-) dysrhythmias (-) Valvular Problems/Murmurs     Neuro/Psych neg Seizures    GI/Hepatic Neg liver ROS, GERD  ,  Endo/Other  neg diabetes  Renal/GU negative Renal ROS     Musculoskeletal   Abdominal   Peds  Hematology   Anesthesia Other Findings   Reproductive/Obstetrics                            Anesthesia Physical Anesthesia Plan  ASA: II and emergent  Anesthesia Plan: General   Post-op Pain Management:    Induction:   PONV Risk Score and Plan: 3 and Dexamethasone, Ondansetron and Midazolam  Airway Management Planned: Oral ETT  Additional Equipment:   Intra-op Plan:   Post-operative Plan:   Informed Consent: I have reviewed the patients History and Physical, chart, labs and discussed the procedure including the risks, benefits and alternatives for the proposed anesthesia with the patient or authorized representative who has indicated his/her understanding and acceptance.       Plan Discussed with:   Anesthesia Plan Comments:         Anesthesia Quick Evaluation

## 2019-02-14 NOTE — Discharge Summary (Signed)
1 Day Post-Op       Procedure(s): DILATATION AND EVACUATION (N/A) LAPAROSCOPY DIAGNOSTIC (N/A) BILATERAL TUBAL LIGATION Subjective: The patient is doing well.  No nausea or vomiting. Pain is adequately controlled. Non tender.  Objective: Vital signs in last 24 hours: Temp:  [97.9 F (36.6 C)-98.7 F (37.1 C)] 98.7 F (37.1 C) (03/18 0403) Pulse Rate:  [88-128] 128 (03/18 0403) Resp:  [11-20] 18 (03/18 0403) BP: (102-163)/(61-89) 128/67 (03/18 0403) SpO2:  [99 %-100 %] 100 % (03/18 0403) Weight:  [70.3 kg] 70.3 kg (03/17 1448)  Intake/Output  Intake/Output Summary (Last 24 hours) at 02/15/2019 0753 Last data filed at 02/15/2019 0538 Gross per 24 hour  Intake 1370.87 ml  Output 700 ml  Net 670.87 ml    Physical Exam:  General: Alert and oriented. CV: RRR Lungs: Clear bilaterally. GI: Soft, Nondistended. Incisions: Clean and dry. Urine: Clear, adequate Extremities: Nontender, no erythema, no edema.  Lab Results: Recent Labs    02/14/19 1527 02/15/19 0424  HGB 12.9 11.9*  HCT 37.6 35.2*  WBC 15.6* 19.1*  PLT 588* 532*                 Results for orders placed or performed during the hospital encounter of 02/14/19 (from the past 24 hour(s))  hCG, quantitative, pregnancy     Status: Abnormal   Collection Time: 02/14/19  2:57 PM  Result Value Ref Range   hCG, Beta Chain, Quant, S 33,341 (H) <5 mIU/mL  CBC with Differential     Status: Abnormal   Collection Time: 02/14/19  3:27 PM  Result Value Ref Range   WBC 15.6 (H) 4.0 - 10.5 K/uL   RBC 4.24 3.87 - 5.11 MIL/uL   Hemoglobin 12.9 12.0 - 15.0 g/dL   HCT 37.2 90.2 - 11.1 %   MCV 88.7 80.0 - 100.0 fL   MCH 30.4 26.0 - 34.0 pg   MCHC 34.3 30.0 - 36.0 g/dL   RDW 55.2 08.0 - 22.3 %   Platelets 588 (H) 150 - 400 K/uL   nRBC 0.0 0.0 - 0.2 %   Neutrophils Relative % 81 %   Neutro Abs 12.7 (H) 1.7 - 7.7 K/uL   Lymphocytes Relative 12 %   Lymphs Abs 1.9 0.7 - 4.0 K/uL   Monocytes Relative 6 %   Monocytes  Absolute 0.9 0.1 - 1.0 K/uL   Eosinophils Relative 0 %   Eosinophils Absolute 0.0 0.0 - 0.5 K/uL   Basophils Relative 0 %   Basophils Absolute 0.1 0.0 - 0.1 K/uL   Immature Granulocytes 1 %   Abs Immature Granulocytes 0.09 (H) 0.00 - 0.07 K/uL  Comprehensive metabolic panel     Status: Abnormal   Collection Time: 02/14/19  3:27 PM  Result Value Ref Range   Sodium 134 (L) 135 - 145 mmol/L   Potassium 3.6 3.5 - 5.1 mmol/L   Chloride 103 98 - 111 mmol/L   CO2 21 (L) 22 - 32 mmol/L   Glucose, Bld 106 (H) 70 - 99 mg/dL   BUN 6 6 - 20 mg/dL   Creatinine, Ser 3.61 0.44 - 1.00 mg/dL   Calcium 9.0 8.9 - 22.4 mg/dL   Total Protein 8.2 (H) 6.5 - 8.1 g/dL   Albumin 4.6 3.5 - 5.0 g/dL   AST 19 15 - 41 U/L   ALT 14 0 - 44 U/L   Alkaline Phosphatase 55 38 - 126 U/L   Total Bilirubin 0.4 0.3 - 1.2 mg/dL  GFR calc non Af Amer >60 >60 mL/min   GFR calc Af Amer >60 >60 mL/min   Anion gap 10 5 - 15  CBC     Status: Abnormal   Collection Time: 02/15/19  4:24 AM  Result Value Ref Range   WBC 19.1 (H) 4.0 - 10.5 K/uL   RBC 3.88 3.87 - 5.11 MIL/uL   Hemoglobin 11.9 (L) 12.0 - 15.0 g/dL   HCT 12.1 (L) 62.4 - 46.9 %   MCV 90.7 80.0 - 100.0 fL   MCH 30.7 26.0 - 34.0 pg   MCHC 33.8 30.0 - 36.0 g/dL   RDW 50.7 22.5 - 75.0 %   Platelets 532 (H) 150 - 400 K/uL   nRBC 0.0 0.0 - 0.2 %  Basic metabolic panel     Status: Abnormal   Collection Time: 02/15/19  4:24 AM  Result Value Ref Range   Sodium 135 135 - 145 mmol/L   Potassium 3.7 3.5 - 5.1 mmol/L   Chloride 105 98 - 111 mmol/L   CO2 21 (L) 22 - 32 mmol/L   Glucose, Bld 175 (H) 70 - 99 mg/dL   BUN 7 6 - 20 mg/dL   Creatinine, Ser 5.18 0.44 - 1.00 mg/dL   Calcium 9.0 8.9 - 33.5 mg/dL   GFR calc non Af Amer >60 >60 mL/min   GFR calc Af Amer >60 >60 mL/min   Anion gap 9 5 - 15    Assessment/Plan: 1 Day Post-Op       Procedure(s): DILATATION AND EVACUATION (N/A) LAPAROSCOPY DIAGNOSTIC (N/A) BILATERAL TUBAL LIGATION  1) Ambulate,  Incentive spirometry 2) Advance diet as tolerated 3) Discharge home today anticipated  4) Elevated WBC: No evidence of infection on exam. Infectious precautions given. 5) scripts sent to pharmacy. Plan Postop visit in 2 weeks.   Christeen Douglas, MD   LOS: 0 days   Christeen Douglas 02/15/2019, 7:53 AM

## 2019-02-14 NOTE — Transfer of Care (Signed)
Immediate Anesthesia Transfer of Care Note  Patient: Danielle West  Procedure(s) Performed: DILATATION AND EVACUATION (N/A Vagina ) LAPAROSCOPY DIAGNOSTIC (N/A Abdomen) BILATERAL TUBAL LIGATION  Patient Location: PACU  Anesthesia Type:General  Level of Consciousness: awake, alert , oriented and patient cooperative  Airway & Oxygen Therapy: Patient Spontanous Breathing  Post-op Assessment: Report given to RN and Post -op Vital signs reviewed and stable  Post vital signs: Reviewed and stable  Last Vitals:  Vitals Value Taken Time  BP 120/76 02/14/2019 10:57 PM  Temp 36.7 C 02/14/2019 10:56 PM  Pulse 94 02/14/2019 10:58 PM  Resp 18 02/14/2019 10:58 PM  SpO2 99 % 02/14/2019 10:58 PM  Vitals shown include unvalidated device data.  Last Pain:  Vitals:   02/14/19 2256  TempSrc:   PainSc: 7          Complications: No apparent anesthesia complications

## 2019-02-14 NOTE — Anesthesia Procedure Notes (Signed)
Procedure Name: Intubation Date/Time: 02/14/2019 9:58 PM Performed by: Waldo Laine, CRNA Pre-anesthesia Checklist: Patient identified, Patient being monitored, Timeout performed, Emergency Drugs available and Suction available Patient Re-evaluated:Patient Re-evaluated prior to induction Oxygen Delivery Method: Circle system utilized Preoxygenation: Pre-oxygenation with 100% oxygen Induction Type: IV induction Ventilation: Mask ventilation without difficulty Laryngoscope Size: Miller and 2 Grade View: Grade I Tube type: Oral Tube size: 7.0 mm Number of attempts: 1 Airway Equipment and Method: Stylet Placement Confirmation: ETT inserted through vocal cords under direct vision,  positive ETCO2 and breath sounds checked- equal and bilateral Secured at: 21 cm Tube secured with: Tape Dental Injury: Teeth and Oropharynx as per pre-operative assessment

## 2019-02-14 NOTE — ED Notes (Signed)
Patient transported to Ultrasound 

## 2019-02-14 NOTE — ED Provider Notes (Signed)
[facility] Emergency Department Provider Note ____________________________________________   First MD Initiated Contact with Patient 02/14/19 1613     (approximate)  I have reviewed the triage vital signs and the nursing notes.   HISTORY  Chief Complaint Ectopic Pregnancy    HPI [name] is a 43 y.o. female G6P3 at 7 weeks by dates who presents with right lower quadrant abdominal pain since yesterday, worse with moving around, and not associated with any fever, nausea or vomiting, or vaginal bleeding.  The patient states that she has not had an IUP visualized yet; her doctor has been trending her hCG over the last 2 weeks.  Past Medical History:  Diagnosis Date  . Abnormal Pap smear   . History of colposcopy with cervical biopsy     Patient Active Problem List   Diagnosis Date Noted  . Allergic rhinitis 02/25/2016  . Fungal toenail infection 02/25/2016  . Tobacco use disorder 09/10/2011  . HSV (herpes simplex virus) anogenital infection 09/10/2011    Past Surgical History:  Procedure Laterality Date  . COLPOSCOPY  12/2012   cx bx-negative/s/p cryosurgery x1  . FINGER SURGERY     right middle finger  . LEEP  03/05/2014   CIN 2  . THERAPEUTIC ABORTION  03/2015   [facility] Choice via pill  . WISDOM TOOTH EXTRACTION      Prior to Admission medications   Medication Sig Start Date End Date Taking? Authorizing Provider  doxycycline (VIBRAMYCIN) 100 MG capsule Take 100 mg by mouth 2 (two) times daily. 02/09/19  Yes [provider]  amoxicillin-clavulanate (AUGMENTIN) 875-125 MG tablet Take 1 tablet by mouth 2 (two) times daily. 05/14/16   Kordsmeier, Amil Amen, FNP  ciclopirox (PENLAC) 8 % solution APPLY TOPICALLY AT BEDTIME OVER NAIL AND SURROUNDING SKIN FOR 1 WEEK, THEN REMOVE AND CONTINUE CYCLE 08/25/16   Copland, Karleen Hampshire, MD  fluconazole (DIFLUCAN) 150 MG tablet Take 1 tablet (150 mg total) by mouth once. 05/14/16   Kordsmeier,  Amil Amen, FNP  fluticasone (FLONASE) 50 MCG/ACT nasal spray Place 2 sprays into both nostrils daily. 02/25/16   Bedsole, Amy E, MD  valACYclovir (VALTREX) 1000 MG tablet Take 1 tablet (1,000 mg total) by mouth daily. Patient taking differently: Take 1,000 mg by mouth as needed.  07/03/11   Constant, Peggy, MD    Allergies Sulfa antibiotics  Family History  Problem Relation Age of Onset  . Diabetes Maternal Grandmother   . Hypertension Father   . Cancer Father        thyriod, prostate  . Diabetes Mother   . Hypertension Mother   . Cancer Mother        breast cancer  . Cancer Brother        kidney  . Diabetes Sister   . Lupus Maternal Aunt   . Cancer Paternal Grandmother        breast cancer    Social History Social History   Tobacco Use  . Smoking status: Former Smoker    Packs/day: 0.30    Types: Cigarettes  . Smokeless tobacco: Never Used  Substance Use Topics  . Alcohol use: Yes    Alcohol/week: 0.0 standard drinks    Comment: ocasionally   . Drug use: No    Review of Systems  Constitutional: No fever. Eyes: No redness. ENT: No sore throat. Cardiovascular: Denies chest pain. Respiratory: Denies shortness of breath. Gastrointestinal: No nausea or vomiting.  Genitourinary: Negative for dysuria.  Negative for vaginal bleeding. Musculoskeletal:  Negative for back pain. Skin: Negative for rash. Neurological: Negative for headache.   ____________________________________________   PHYSICAL EXAM:  VITAL SIGNS: ED Triage Vitals  Enc Vitals Group     BP 02/14/19 1447 (!) 163/87     Pulse Rate 02/14/19 1447 89     Resp 02/14/19 1447 18     Temp 02/14/19 1447 97.9 F (36.6 C)     Temp Source 02/14/19 1447 Oral     SpO2 02/14/19 1447 100 %     Weight 02/14/19 1448 155 lb (70.3 kg)     Height 02/14/19 1448 5\' 3"  (1.6 m)     Head Circumference --      Peak Flow --      Pain Score 02/14/19 1448 8     Pain Loc --      Pain Edu? --      Excl. in GC? --      Constitutional: Alert and oriented. Well appearing and in no acute distress. Eyes: Conjunctivae are normal.  Head: Atraumatic. Nose: No congestion/rhinnorhea. Mouth/Throat: Mucous membranes are moist.   Neck: Normal range of motion.  Cardiovascular: Good peripheral circulation. Respiratory: Normal respiratory effort.   Gastrointestinal: Soft and nontender. No distention.  Genitourinary: No flank tenderness. Musculoskeletal: Extremities warm and well perfused.  Neurologic:  Normal speech and language. No gross focal neurologic deficits are appreciated.  Skin:  Skin is warm and dry. No rash noted. Psychiatric: Mood and affect are normal. Speech and behavior are normal.  ____________________________________________   LABS (all labs ordered are listed, but only abnormal results are displayed)  Labs Reviewed  HCG, QUANTITATIVE, PREGNANCY - Abnormal; Notable for the following components:      Result Value   hCG, Beta Chain, Quant, S 33,341 (*)    All other components within normal limits  CBC WITH DIFFERENTIAL/PLATELET - Abnormal; Notable for the following components:   WBC 15.6 (*)    Platelets 588 (*)    Neutro Abs 12.7 (*)    Abs Immature Granulocytes 0.09 (*)    All other components within normal limits  COMPREHENSIVE METABOLIC PANEL - Abnormal; Notable for the following components:   Sodium 134 (*)    CO2 21 (*)    Glucose, Bld 106 (*)    Total Protein 8.2 (*)    All other components within normal limits   ____________________________________________  EKG   ____________________________________________  RADIOLOGY  US pelvis: Possible hemorrhagic products in the uterus; no IUP visualized  ____________________________________________   PROCEDURES  Procedure(s) performed: No  Procedures  Critical Care performed: No ____________________________________________   INITIAL IMPRESSION / ASSESSMENT AND PLAN / ED COURSE  Pertinent labs & imaging results that were  available during my care of the patient were reviewed by me and considered in my medical decision making (see chart for details).  43 year old female G6P3 at 7 weeks by dates presents with right lower abdominal pain with no vaginal bleeding or other acute symptoms.  She states that she has had her hCG trended as an outpatient over the last few weeks with no IUP visualized.  On exam, the patient is well-appearing.  Her vital signs are normal except for hypertension.  The abdomen is soft with no focal tenderness.  hCG today is 33,000.  Ultrasound shows no IUP.  I will consult OB/GYN.  ----------------------------------------- 8:02 PM on 02/14/2019 -----------------------------------------  I consulted Dr. Dalbert Garnet from OB/GYN who came to evaluate the patient in the ED.  Patient will be admitted to the OR  for surgery.  She remains hemodynamically stable in the ED. ____________________________________________   FINAL CLINICAL IMPRESSION(S) / ED DIAGNOSES  Final diagnoses:  Pregnancy of unknown anatomic location      NEW MEDICATIONS STARTED DURING THIS VISIT:  New Prescriptions   No medications on file     Note:  This document was prepared using Dragon voice recognition software and may include unintentional dictation errors.    Dionne Bucy, MD 02/14/19 2002

## 2019-02-14 NOTE — Anesthesia Postprocedure Evaluation (Signed)
Anesthesia Post Note  Patient: [employer]  Procedure(s) Performed: DILATATION AND EVACUATION (N/A Vagina ) LAPAROSCOPY DIAGNOSTIC (N/A Abdomen) BILATERAL TUBAL LIGATION (Abdomen)  Patient location during evaluation: PACU Anesthesia Type: General Level of consciousness: awake and alert Pain management: pain level controlled Vital Signs Assessment: post-procedure vital signs reviewed and stable Respiratory status: spontaneous breathing and respiratory function stable Cardiovascular status: stable Anesthetic complications: no     Last Vitals:  Vitals:   02/14/19 2311 02/14/19 2316  BP: 110/70   Pulse: 97 (!) 102  Resp: 18 16  Temp:    SpO2: 100% 100%    Last Pain:  Vitals:   02/14/19 2316  TempSrc:   PainSc: 5                  Mercie Balsley K

## 2019-02-14 NOTE — ED Notes (Signed)
Pt requesting to not leave BP in place and to not have frequent BP taken.  Pt states she is very nervous and that having her BP taken frequently is making it worse.  As pt VSS, will spot check BP per pt request.

## 2019-02-14 NOTE — Discharge Instructions (Signed)
Laparoscopic Ovarian Surgery Discharge Instructions  For the next three days, take ibuprofen and acetaminophen on a schedule, every 8 hours. You can take them together or you can intersperse them, and take one every four hours. I also gave you gabapentin for nighttime, to help you sleep and also to control pain. Take gabapentin medicines at night for at least the next 3 nights. You also have a narcotic, oxycodone, to take as needed if the above medicines don't help.  Postop constipation is a major cause of pain. Stay well hydrated, walk as you tolerate, and take over the counter senna as well as stool softeners if you need them.   RISKS AND COMPLICATIONS  Infection. Bleeding. Injury to surrounding organs. Anesthetic side effects.   PROCEDURE  You may be given a medicine to help you relax (sedative) before the procedure. You will be given a medicine to make you sleep (general anesthetic) during the procedure. A tube will be put down your throat to help your breath while under general anesthesia. Several small cuts (incisions) are made in the lower abdominal area and one incision is made near the belly button. Your abdominal area will be inflated with a safe gas (carbon dioxide). This helps give the surgeon room to operate, visualize, and helps the surgeon avoid other organs. A thin, lighted tube (laparoscope) with a camera attached is inserted into your abdomen through the incision near the belly button. Other small instruments may also be inserted through other abdominal incisions. The ovary is located and are removed. After the ovary is removed, the gas is released from the abdomen. The incisions will be closed with stitches (sutures), and Dermabond. A bandage may be placed over the incisions.  AFTER THE PROCEDURE  You will also have some mild abdominal discomfort for 3-7 days. You will be given pain medicine to ease any discomfort. As long as there are no problems, you may be allowed to  go home. Someone will need to drive you home and be with you for at least 24 hours once home. You may have some mild discomfort in the throat. This is from the tube placed in your throat while you were sleeping. You may experience discomfort in the shoulder area from some trapped air between the liver and diaphragm. This sensation is normal and will slowly go away on its own.  HOME CARE INSTRUCTIONS  Take all medicines as directed. Only take over-the-counter or prescription medicines for pain, discomfort, or fever as directed by your caregiver. Resume daily activities as directed. Showers are preferred over baths for 2 weeks. You may resume sexual activities in 1 week or as you feel you would like to. Do not drive while taking narcotics.  SEEK MEDICAL CARE IF: . There is increasing abdominal pain. You feel lightheaded or faint. You have the chills. You have an oral temperature above 102 F (38.9 C). There is pus-like (purulent) drainage from any of the wounds. You are unable to pass gas or have a bowel movement. You feel sick to your stomach (nauseous) or throw up (vomit) and can't control it with your medicines.  MAKE SURE YOU:  Understand these instructions. Will watch your condition. Will get help right away if you are not doing well or get worse.  ExitCare Patient Information 2013 [employer].      

## 2019-02-14 NOTE — ED Triage Notes (Signed)
Pt in via POV, with right pelvic pain, worsening over the last two days.  Pt recently followed by OB, thinking she was pregnant due to last menstrual cycle 12/24/18.  Pt with positive Hcg levels but ultrasound negative for gestational sac.  Pt to see specialist this week but pain has since developed.  Vitals WDL.  Pt appears uncomfortable.

## 2019-02-14 NOTE — H&P (Signed)
Consult History and Physical   SERVICE: Gynecology   Patient Name: Danielle West Patient MRN:   [id number]  CC: RLQ pain,  Pregnancy of unknown location  HPI: Danielle West is a 43 y.o. [id number] with RLQ pain, being followed by [facility] for pregnancy of unknown location. I have no records. Pt reports approx LMP of 12/25/2018. Has been followed with beta hcg and ultrasounds, last 5 days ago with a quant of 20K and an ultrasound without IUP.  Today RLQ pain increasing with +rovsing's sign but negative peritoneal signs otherwise. Mild exam overall. No vaginal bleeding  TVUS with large amount of heterogenous material in uterus, small intraovarian cyst on right, no free fluid in pelvis. Most likely consistent with failed IUP, but that is not consistent with story of nothing in uterus and a high beta quant last week  Last ate 10  Hrs ago  No hx of GC/CT Not on birth control currently Rh positive   Review of Systems: positives in bold GEN:   fevers, chills, weight changes, appetite changes, fatigue, night sweats HEENT:  HA, vision changes, hearing loss, congestion, rhinorrhea, sinus pressure, dysphagia CV:   CP, palpitations PULM:  SOB, cough GI:  abd pain, N/V/D/C GU:  dysuria, urgency, frequency MSK:  arthralgias, myalgias, back pain, swelling SKIN:  rashes, color changes, pallor NEURO:  numbness, weakness, tingling, seizures, dizziness, tremors PSYCH:  depression, anxiety, behavioral problems, confusion  HEME/LYMPH:  easy bruising or bleeding ENDO:  heat/cold intolerance  Past Obstetrical History: OB History    Gravida  6   Para  3   Term  3   Preterm      AB  3   Living  3     SAB  1   TAB  1   Ectopic      Multiple      Live Births  3           Past Gynecologic History: Patient's last menstrual period was 12/24/2018 (approximate). Menstrual frequency Q 4 wks  Past Medical History: Past Medical History:  Diagnosis Date  . Abnormal  Pap smear   . History of colposcopy with cervical biopsy     Past Surgical History:   Past Surgical History:  Procedure Laterality Date  . COLPOSCOPY  12/2012   cx bx-negative/s/p cryosurgery x1  . FINGER SURGERY     right middle finger  . LEEP  03/05/2014   CIN 2  . THERAPEUTIC ABORTION  03/2015   [facility] Choice via pill  . WISDOM TOOTH EXTRACTION      Family History:  family history includes Cancer in her brother, father, mother, and paternal grandmother; Diabetes in her maternal grandmother, mother, and sister; Hypertension in her father and mother; Lupus in her maternal aunt.  Social History:  Social History   Socioeconomic History  . Marital status: Married    Spouse name: Not on file  . Number of children: Not on file  . Years of education: Not on file  . Highest education level: Not on file  Occupational History  . Occupation: Runner, broadcasting/film/video  Social Needs  . Financial resource strain: Not on file  . Food insecurity:    Worry: Not on file    Inability: Not on file  . Transportation needs:    Medical: Not on file    Non-medical: Not on file  Tobacco Use  . Smoking status: Former Smoker    Packs/day: 0.30    Types: Cigarettes  .  Smokeless tobacco: Never Used  Substance and Sexual Activity  . Alcohol use: Yes    Alcohol/week: 0.0 standard drinks    Comment: ocasionally   . Drug use: No  . Sexual activity: Yes    Partners: Male  Lifestyle  . Physical activity:    Days per week: Not on file    Minutes per session: Not on file  . Stress: Not on file  Relationships  . Social connections:    Talks on phone: Not on file    Gets together: Not on file    Attends religious service: Not on file    Active member of club or organization: Not on file    Attends meetings of clubs or organizations: Not on file    Relationship status: Not on file  . Intimate partner violence:    Fear of current or ex partner: Not on file    Emotionally abused: Not on file     Physically abused: Not on file    Forced sexual activity: Not on file  Other Topics Concern  . Not on file  Social History Narrative   ** Merged History Encounter **        Home Medications:  Medications reconciled in EPIC  No current facility-administered medications on file prior to encounter.    Current Outpatient Medications on File Prior to Encounter  Medication Sig Dispense Refill  . doxycycline (VIBRAMYCIN) 100 MG capsule Take 100 mg by mouth 2 (two) times daily.    Marland Kitchen amoxicillin-clavulanate (AUGMENTIN) 875-125 MG tablet Take 1 tablet by mouth 2 (two) times daily. 20 tablet 0  . ciclopirox (PENLAC) 8 % solution APPLY TOPICALLY AT BEDTIME OVER NAIL AND SURROUNDING SKIN FOR 1 WEEK, THEN REMOVE AND CONTINUE CYCLE 6.6 mL 0  . fluconazole (DIFLUCAN) 150 MG tablet Take 1 tablet (150 mg total) by mouth once. 1 tablet 0  . fluticasone (FLONASE) 50 MCG/ACT nasal spray Place 2 sprays into both nostrils daily. 16 g 6  . valACYclovir (VALTREX) 1000 MG tablet Take 1 tablet (1,000 mg total) by mouth daily. (Patient taking differently: Take 1,000 mg by mouth as needed. ) 30 tablet 3    Allergies:  Allergies  Allergen Reactions  . Sulfa Antibiotics Rash    Physical Exam:  Temp:  [97.9 F (36.6 C)] 97.9 F (36.6 C) (03/17 1447) Pulse Rate:  [89-99] 99 (03/17 1658) Resp:  [18] 18 (03/17 1658) BP: (143-163)/(87-89) 143/89 (03/17 1658) SpO2:  [100 %] 100 % (03/17 1658) Weight:  [70.3 kg] 70.3 kg (03/17 1448)   General Appearance:  Well developed, well nourished, no acute distress, alert and oriented x3 HEENT:  Normocephalic atraumatic, extraocular movements intact, moist mucous membranes Cardiovascular:  Normal S1/S2, regular rate and rhythm, no murmurs Pulmonary:  clear to auscultation, no wheezes, rales or rhonchi, symmetric air entry, good air exchange Abdomen:  Bowel sounds present, soft, nontender, nondistended, no abnormal masses, no epigastric pain Extremities:  Full range of  motion, no pedal edema, 2+ distal pulses, no tenderness Skin:  normal coloration and turgor, no rashes, no suspicious skin lesions noted  Neurologic:  Cranial nerves 2-12 grossly intact, normal muscle tone, strength 5/5 all four extremities Psychiatric:  Normal mood and affect, appropriate, no AH/VH Pelvic:  NEFG, no vulvar masses or lesions, normal vaginal mucosa, no vaginal bleeding or discharge, cervix closed.    Labs/Studies:   CBC and Coags:  Lab Results  Component Value Date   WBC 15.6 (H) 02/14/2019   NEUTOPHILPCT 81 02/14/2019  EOSPCT 0 02/14/2019   BASOPCT 0 02/14/2019   LYMPHOPCT 12 02/14/2019   HGB 12.9 02/14/2019   HCT 37.6 02/14/2019   MCV 88.7 02/14/2019   PLT 588 (H) 02/14/2019   CMP:  Lab Results  Component Value Date   NA 134 (L) 02/14/2019   K 3.6 02/14/2019   CL 103 02/14/2019   CO2 21 (L) 02/14/2019   BUN 6 02/14/2019   CREATININE 0.58 02/14/2019   PROT 8.2 (H) 02/14/2019   BILITOT 0.4 02/14/2019   ALT 14 02/14/2019   AST 19 02/14/2019   ALKPHOS 55 02/14/2019   Other Imaging: US Ob Comp Less 14 Wks  Result Date: 02/14/2019 CLINICAL DATA:  Right lower quadrant pain EXAM: OBSTETRIC <14 WK [place] AND TRANSVAGINAL OB US TECHNIQUE: Both transabdominal and transvaginal ultrasound examinations were performed for complete evaluation of the gestation as well as the maternal uterus, adnexal regions, and pelvic cul-de-sac. Transvaginal technique was performed to assess early pregnancy. COMPARISON:  None. FINDINGS: Intrauterine gestational sac: None Yolk sac:  Not Visualized. Embryo:  Not Visualized. Cardiac Activity: Not Visualized. Maternal uterus/adnexae: Right ovary measures 2.8 x 2.6 x 2.7 cm and contains a 2.2 cm dominant follicle. Left ovary measures 2.1 x 1.6 x 1.9 cm. Heterogenous appearance of the endometrial echoes with hyper and hypoechoic material within the canal. IMPRESSION: 1. No IUP identified. Heterogenous appearance of the endometrial stripe with mixed  hyper and hypoechoic material, possibly hemorrhagic products. Findings are consistent with pregnancy of unknown location, primary differential includes recent failed pregnancy and occult ectopic given HCG level. Recommend close clinical monitoring, trending of HCG, and follow-up ultrasound as indicated. Electronically Signed   By: Jasmine Pang M.D.   On: 02/14/2019 17:20   US Ob Transvaginal  Result Date: 02/14/2019 CLINICAL DATA:  Right lower quadrant pain EXAM: OBSTETRIC <14 WK [place] AND TRANSVAGINAL OB US TECHNIQUE: Both transabdominal and transvaginal ultrasound examinations were performed for complete evaluation of the gestation as well as the maternal uterus, adnexal regions, and pelvic cul-de-sac. Transvaginal technique was performed to assess early pregnancy. COMPARISON:  None. FINDINGS: Intrauterine gestational sac: None Yolk sac:  Not Visualized. Embryo:  Not Visualized. Cardiac Activity: Not Visualized. Maternal uterus/adnexae: Right ovary measures 2.8 x 2.6 x 2.7 cm and contains a 2.2 cm dominant follicle. Left ovary measures 2.1 x 1.6 x 1.9 cm. Heterogenous appearance of the endometrial echoes with hyper and hypoechoic material within the canal. IMPRESSION: 1. No IUP identified. Heterogenous appearance of the endometrial stripe with mixed hyper and hypoechoic material, possibly hemorrhagic products. Findings are consistent with pregnancy of unknown location, primary differential includes recent failed pregnancy and occult ectopic given HCG level. Recommend close clinical monitoring, trending of HCG, and follow-up ultrasound as indicated. Electronically Signed   By: Jasmine Pang M.D.   On: 02/14/2019 17:20     Assessment / Plan:   Danielle West is a 43 y.o. [id number] who presents with pregnancy of unknown location, mostly likely SAB, possible ectopic.   Spontaneous miscarriage: Causes of spontaneous miscarriage discussed with patient, including prevalence, common causes, and the expectation  that this event does not increase the chance that she will miscarry again in the future. Emotional support given.  Management options discussed, including: Expectant, medical and surgical.  Pt has opted for: suction D&C with dx lap and per her request, bilateral salpingectomy IF we find ectopic or otherwise abnormal Fallopian tubes. Otherwise will elect for LARC with outpatient clinic  Consents signed today. Risks of surgery were discussed  with the patient including but not limited to: bleeding which may require transfusion; infection which may require antibiotics; injury to uterus or surrounding organs; intrauterine scarring which may impair future fertility; need for additional procedures including laparotomy or laparoscopy; and other postoperative/anesthesia complications. Written informed consent was obtained.  This is a scheduled same-day surgery. She will have a postop visit in 2 weeks to review operative findings and pathology.

## 2019-02-14 NOTE — Anesthesia Post-op Follow-up Note (Signed)
Anesthesia QCDR form completed.        

## 2019-02-15 ENCOUNTER — Encounter: Payer: Self-pay | Admitting: Obstetrics and Gynecology

## 2019-02-15 LAB — BASIC METABOLIC PANEL
Anion gap: 9 (ref 5–15)
BUN: 7 mg/dL (ref 6–20)
CO2: 21 mmol/L — ABNORMAL LOW (ref 22–32)
Calcium: 9 mg/dL (ref 8.9–10.3)
Chloride: 105 mmol/L (ref 98–111)
Creatinine, Ser: 0.71 mg/dL (ref 0.44–1.00)
GFR calc Af Amer: >60 mL/min (ref >60)
GFR calc non Af Amer: >60 mL/min (ref >60)
Glucose, Bld: 175 mg/dL — ABNORMAL HIGH (ref 70–99)
POTASSIUM: 3.7 mmol/L (ref 3.5–5.1)
Sodium: 135 mmol/L (ref 135–145)

## 2019-02-15 LAB — CBC
HEMATOCRIT: 35.2 % — AB (ref 36.0–46.0)
Hemoglobin: 11.9 g/dL — ABNORMAL LOW (ref 12.0–15.0)
MCH: 30.7 pg (ref 26.0–34.0)
MCHC: 33.8 g/dL (ref 30.0–36.0)
MCV: 90.7 fL (ref 80.0–100.0)
Platelets: 532 10*3/uL — ABNORMAL HIGH (ref 150–400)
RBC: 3.88 MIL/uL (ref 3.87–5.11)
RDW: 12.1 % (ref 11.5–15.5)
WBC: 19.1 10*3/uL — ABNORMAL HIGH (ref 4.0–10.5)
nRBC: 0 % (ref 0.0–0.2)

## 2019-02-15 MED ORDER — ACETAMINOPHEN 500 MG PO TABS
1000.0000 mg | ORAL_TABLET | Freq: Four times a day (QID) | ORAL | Status: DC
  Administered 2019-02-15: 1000 mg via ORAL
  Filled 2019-02-15: qty 2

## 2019-02-15 MED ORDER — MENTHOL 3 MG MT LOZG
1.0000 | LOZENGE | OROMUCOSAL | Status: DC | PRN
  Filled 2019-02-15: qty 9

## 2019-02-15 MED ORDER — IBUPROFEN 800 MG PO TABS
800.0000 mg | ORAL_TABLET | Freq: Three times a day (TID) | ORAL | Status: DC
  Administered 2019-02-15: 800 mg via ORAL
  Filled 2019-02-15: qty 1

## 2019-02-15 MED ORDER — ONDANSETRON HCL 4 MG/2ML IJ SOLN: 4.0000 mg | Freq: Four times a day (QID) | INTRAMUSCULAR | Status: DC | PRN

## 2019-02-15 MED ORDER — LACTATED RINGERS IV SOLN
INTRAVENOUS | Status: DC
  Administered 2019-02-15: 01:00:00 via INTRAVENOUS

## 2019-02-15 MED ORDER — ONDANSETRON HCL 4 MG PO TABS: 4.0000 mg | ORAL_TABLET | Freq: Four times a day (QID) | ORAL | Status: DC | PRN

## 2019-02-15 MED ORDER — DOCUSATE SODIUM 100 MG PO CAPS
100.0000 mg | ORAL_CAPSULE | Freq: Two times a day (BID) | ORAL | Status: DC
  Administered 2019-02-15: 100 mg via ORAL
  Filled 2019-02-15: qty 1

## 2019-02-15 MED ORDER — SIMETHICONE 80 MG PO CHEW: 80.0000 mg | CHEWABLE_TABLET | Freq: Four times a day (QID) | ORAL | Status: DC | PRN

## 2019-02-15 MED ORDER — ALUM & MAG HYDROXIDE-SIMETH 200-200-20 MG/5ML PO SUSP: 30.0000 mL | ORAL | Status: DC | PRN

## 2019-02-15 MED ORDER — HYDROMORPHONE HCL 2 MG PO TABS: 1.0000 mg | ORAL_TABLET | Freq: Four times a day (QID) | ORAL | Status: DC | PRN

## 2019-02-15 NOTE — Plan of Care (Signed)
Vs stable; up with assistance; has voided post op; tolerating regular diet; taking tylenol and motrin for pain control; has asked about being discharged today

## 2019-02-15 NOTE — Progress Notes (Signed)
Pt discharged home.  Discharge instructions, prescriptions and follow up appointment given to and reviewed with pt.  Pt verbalized understanding.  Escorted by staff. 

## 2019-02-16 LAB — SURGICAL PATHOLOGY

## 2020-01-18 ENCOUNTER — Ambulatory Visit: Payer: BC Managed Care – PPO

## 2020-01-22 ENCOUNTER — Ambulatory Visit: Payer: BC Managed Care – PPO | Attending: Family

## 2020-01-22 DIAGNOSIS — Z23 Encounter for immunization: Secondary | ICD-10-CM | POA: Insufficient documentation

## 2020-01-22 NOTE — Progress Notes (Signed)
   Covid-19 Vaccination Clinic  Name:  Danielle West    MRN: [id number] DOB: November 16, 1976  01/22/2020  [name] was observed post Covid-19 immunization for 15 minutes without incidence. She was provided with Vaccine Information Sheet and instruction to access the V-Safe system.   [name] was instructed to call 911 with any severe reactions post vaccine: Marland Kitchen Difficulty breathing  . Swelling of your face and throat  . A fast heartbeat  . A bad rash all over your body  . Dizziness and weakness    Immunizations Administered    Name Date Dose VIS Date Route   Moderna COVID-19 Vaccine 01/22/2020 12:28 PM 0.5 mL 10/31/2019 Intramuscular   Manufacturer: Moderna   Lot: [id number]   NDC: 05697-948-01

## 2020-02-20 ENCOUNTER — Ambulatory Visit: Payer: BC Managed Care – PPO | Attending: Family

## 2020-02-20 DIAGNOSIS — Z23 Encounter for immunization: Secondary | ICD-10-CM

## 2020-02-20 NOTE — Progress Notes (Signed)
   Covid-19 Vaccination [facility]  Name:  Danielle West    MRN: [id number] DOB: 08/12/1976  02/20/2020  Ms. Heard was observed post Covid-19 immunization for 15 minutes without incident. She was provided with Vaccine Information Sheet and instruction to access the V-Safe system.   [name] was instructed to call 911 with any severe reactions post vaccine: Marland Kitchen Difficulty breathing  . Swelling of face and throat  . A fast heartbeat  . A bad rash all over body  . Dizziness and weakness   Immunizations Administered    Name Date Dose VIS Date Route   Moderna COVID-19 Vaccine 02/20/2020  3:43 PM 0.5 mL 10/31/2019 Intramuscular   Manufacturer: Moderna   Lot: [id number]   NDC: 81840-375-43

## 2020-02-27 ENCOUNTER — Ambulatory Visit: Payer: BC Managed Care – PPO

## 2020-06-07 IMAGING — US TRANSVAGINAL OB ULTRASOUND
1 series · 14 of 28 positions shown · non-contrast
Comparison: None.

CLINICAL DATA: Right lower quadrant pain

EXAM:
OBSTETRIC <14 WK US AND TRANSVAGINAL OB US
TECHNIQUE: Both transabdominal and transvaginal ultrasound examinations were
performed for complete evaluation of the gestation as well as the
maternal uterus, adnexal regions, and pelvic cul-de-sac.
Transvaginal technique was performed to assess early pregnancy.

[Series 1: transvaginal ob ultrasound · 0.11mm/px · 121 acquisitions, 14 frames shown]
[im 5/121]
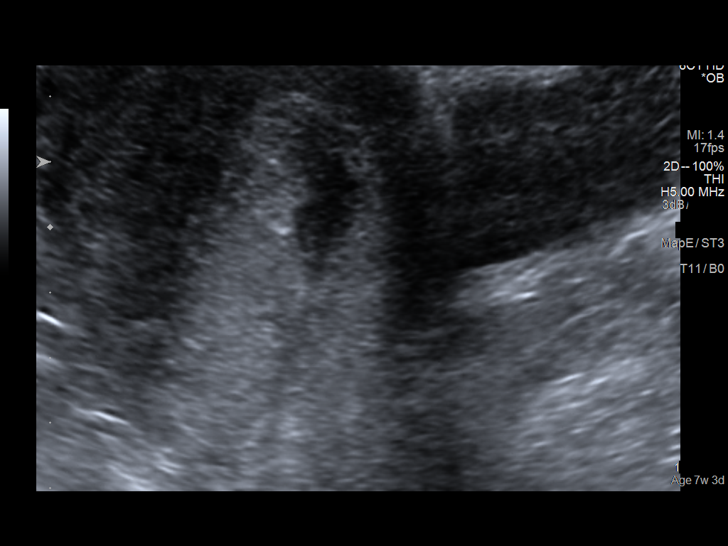
[im 14/121]
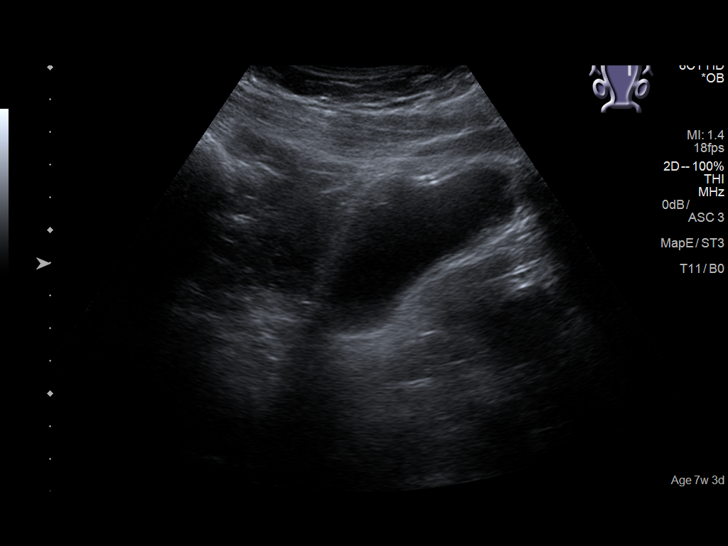
[im 23/121]
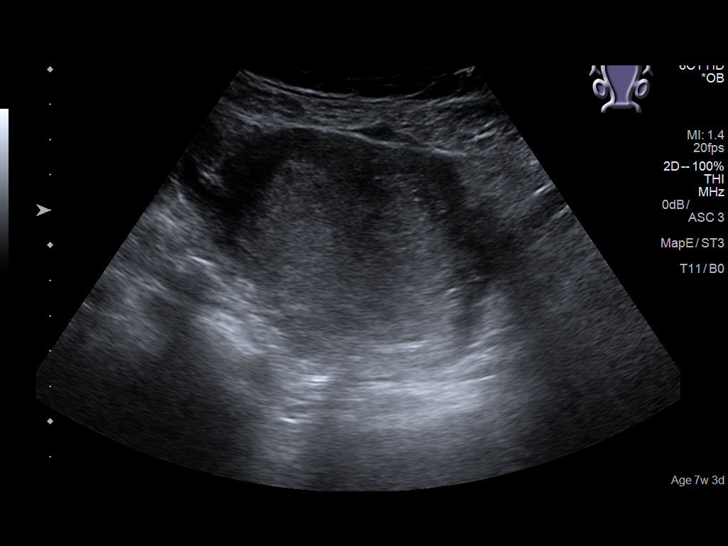
[im 32/121]
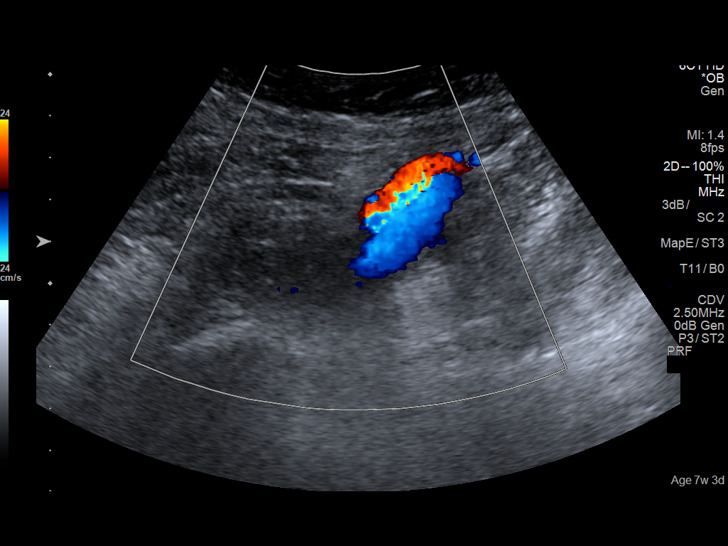
[im 41/121]
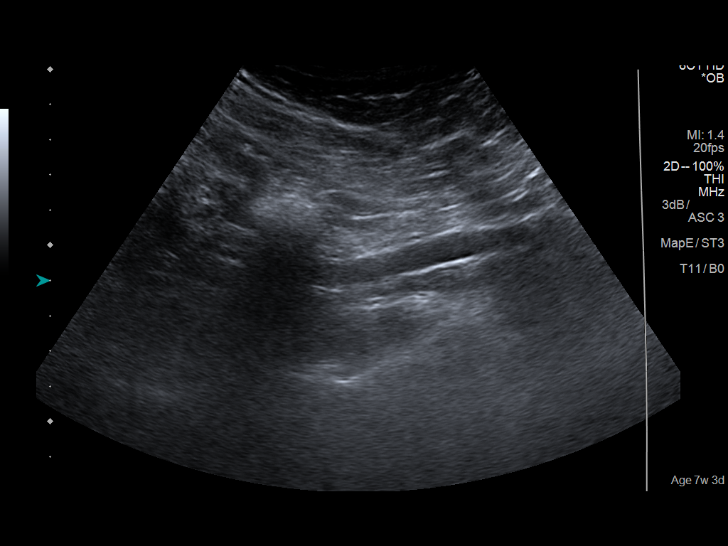
[im 49/121]
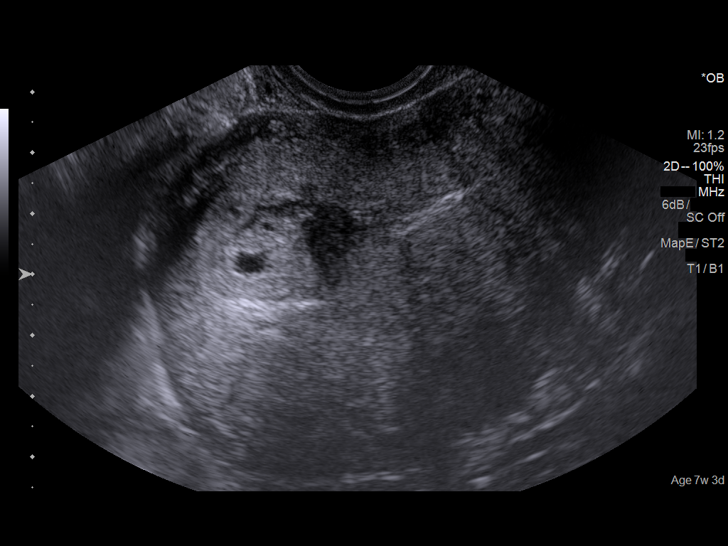
[im 58/121]
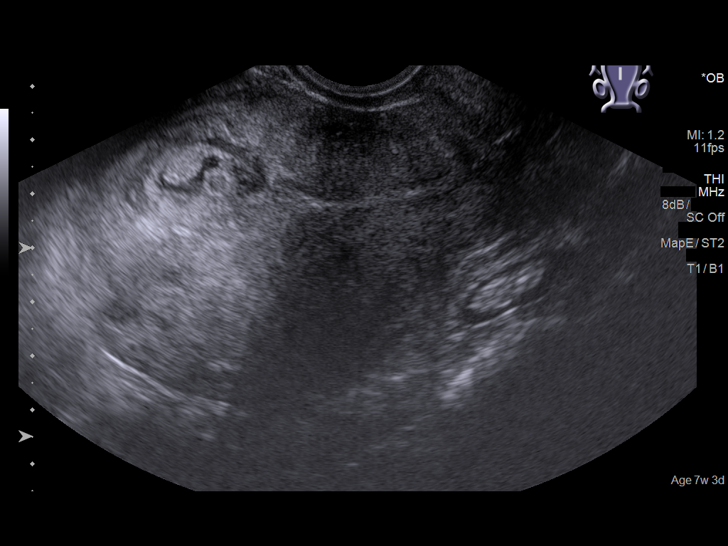
[im 67/121]
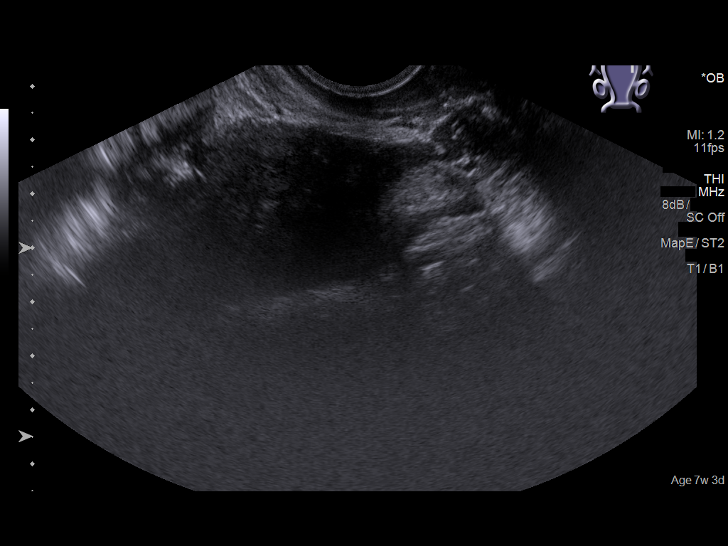
[im 76/121]
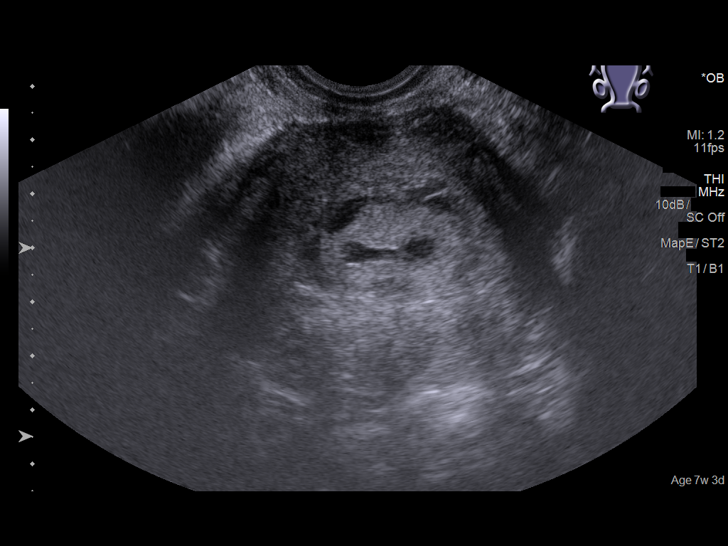
[im 85/121]
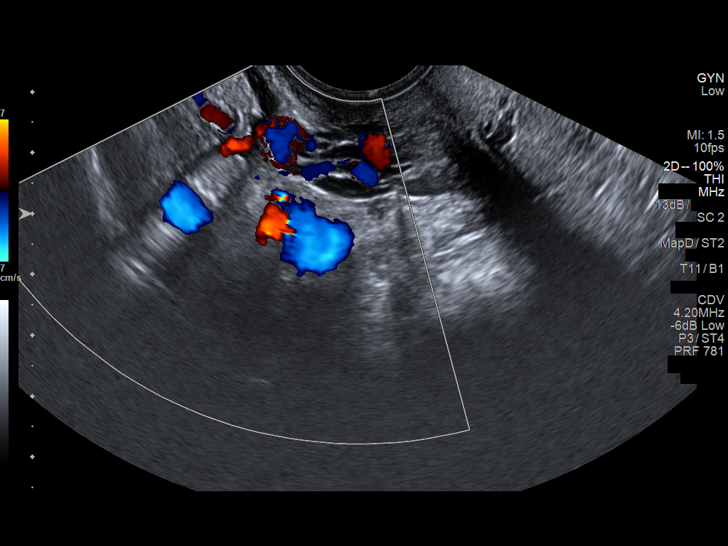
[im 94/121]
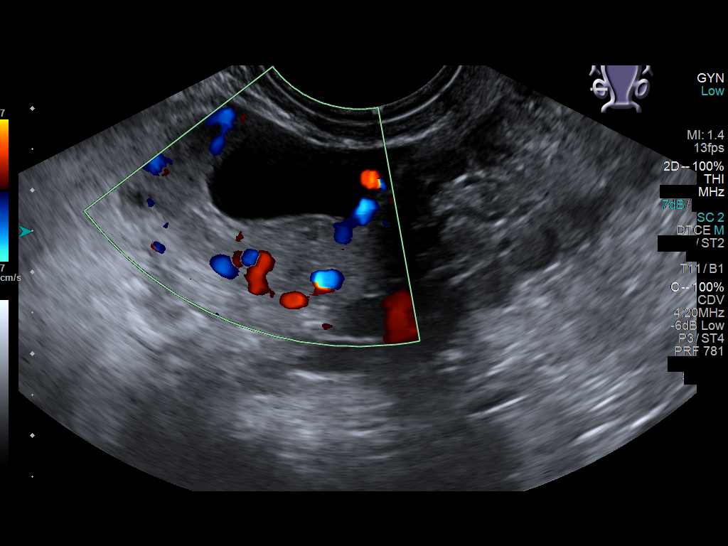
[im 103/121]
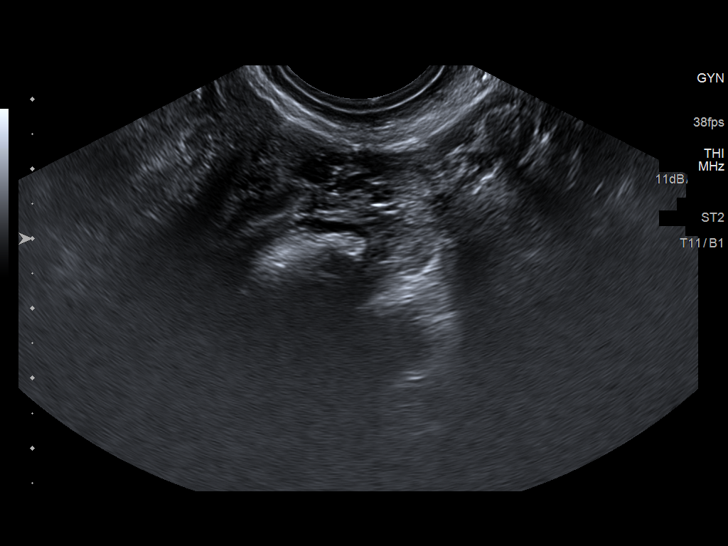
[im 112/121]
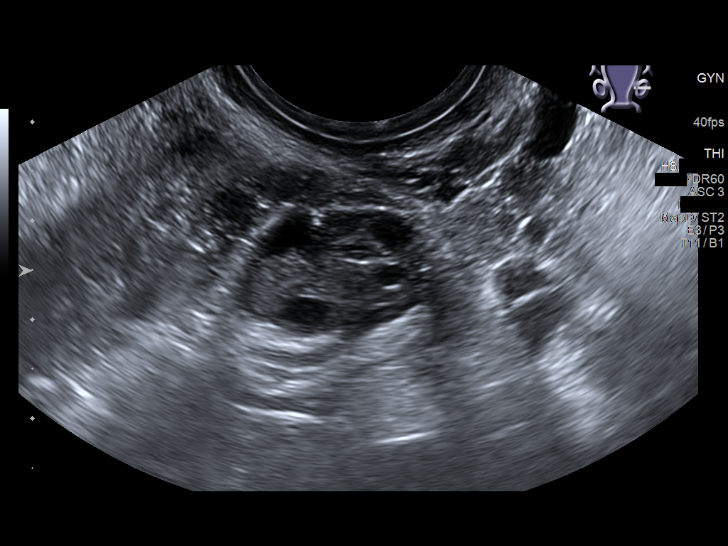
[im 121/121]
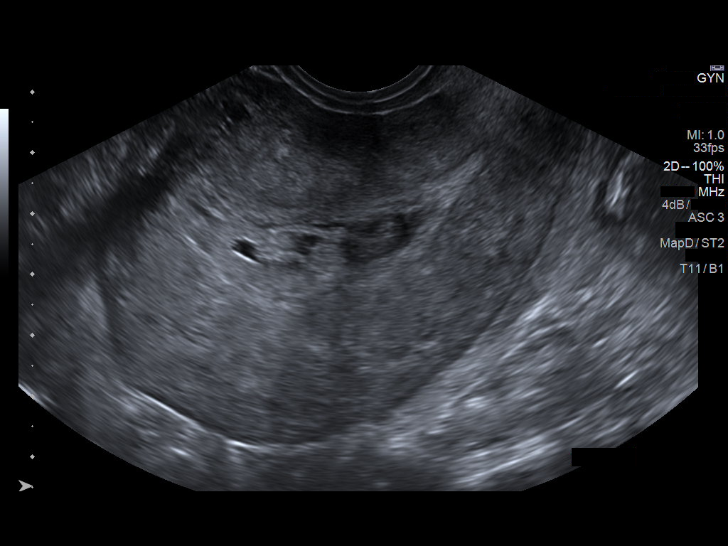

[14 of 28 positions shown; findings below may reference images not displayed]

FINDINGS: Intrauterine gestational sac: None

Yolk sac:  Not Visualized.

Embryo:  Not Visualized.

Cardiac Activity: Not Visualized.

Maternal uterus/adnexae: Right ovary measures 2.8 x 2.6 x 2.7 cm and
contains a 2.2 cm dominant follicle. Left ovary measures 2.1 x 1.6 x
1.9 cm. Heterogenous appearance of the endometrial echoes with hyper
and hypoechoic material within the canal.
IMPRESSION: 1. No IUP identified. Heterogenous appearance of the endometrial
stripe with mixed hyper and hypoechoic material, possibly
hemorrhagic products. Findings are consistent with pregnancy of
unknown location, primary differential includes recent failed
pregnancy and occult ectopic given HCG level. Recommend close
clinical monitoring, trending of HCG, and follow-up ultrasound as
indicated.

## 2020-06-24 ENCOUNTER — Telehealth: Payer: Self-pay | Admitting: *Deleted

## 2020-06-24 NOTE — Telephone Encounter (Signed)
I spoke with pt and informed that she did not need a driver if she had a toenail procedure, but recommended a open-toed shoe or loose shoe. Pt asked if she would get a needle. I told pt that a cold spray would be given prior to the injection and the toe would be numb from the base up. Pt states she has anxiety and should she bring her blood pressure or would we take it. I told pt that we would take her blood pressure here but to explain her anxiety as well.

## 2020-06-24 NOTE — Telephone Encounter (Signed)
Pt states she knows she has a toenail fungus and the toenail has a split and should probably come off, if surgery is needed will she have to have someone drive her.

## 2020-06-25 ENCOUNTER — Telehealth: Payer: Self-pay | Admitting: Podiatry

## 2020-06-25 ENCOUNTER — Ambulatory Visit: Payer: BC Managed Care – PPO | Admitting: Podiatry

## 2020-06-25 ENCOUNTER — Other Ambulatory Visit: Payer: Self-pay

## 2020-06-25 ENCOUNTER — Encounter: Payer: Self-pay | Admitting: Podiatry

## 2020-06-25 VITALS — BP 141/86 | HR 120 | Temp 98.0°F | Resp 16

## 2020-06-25 DIAGNOSIS — M79675 Pain in left toe(s): Secondary | ICD-10-CM

## 2020-06-25 DIAGNOSIS — M79674 Pain in right toe(s): Secondary | ICD-10-CM

## 2020-06-25 DIAGNOSIS — B351 Tinea unguium: Secondary | ICD-10-CM

## 2020-06-25 MED ORDER — CICLOPIROX 8 % EX SOLN: Freq: Every day | CUTANEOUS | 0 refills | Status: DC

## 2020-06-25 NOTE — Progress Notes (Signed)
  Subjective:  Patient ID: Danielle West, female    DOB: [birth year],  MRN: [id number]  Chief Complaint  Patient presents with  . Nail Problem    Left foot; Hallux; split in nail; pt stated, "I went to a doctor in 2017 and used a polish on my nail for nail fungus for a year; it did help a little; needs nail checked today"; x6 yrs  . Laser Treatment    Wants to discuss getting laser treatment    44 y.o. female presents with the above complaint. History confirmed with patient.  Previously used ciclopirox with improvement of years ago.  She is hesitant to use oral antifungals.  Objective:  Physical Exam: warm, good capillary refill, no trophic changes or ulcerative lesions, normal DP and PT pulses and normal sensory exam. Left Foot: Hallux nail is mycotic with split nail nearly to base, and onycholysis of distal 50%  Assessment:   1. Onychomycosis   2. Pain due to onychomycosis of toenails of both feet      Plan:  Patient was evaluated and treated and all questions answered.  Discussed the etiology and treatment options for the condition in detail with the patient. Educated patient on the topical and oral treatment options for mycotic nails. Recommended debridement of the nails today. Sharp and mechanical debridement performed of all painful and mycotic nails today. Nails debrided in length and thickness using a nail nipper and a mechanical burr to level of comfort. Discussed treatment options including appropriate shoe gear.   She is interested in laser therapy, and I discussed the risks and benefits of this versus topical versus oral therapy.  I recommended that she pursue this with combination topical and laser therapy.  Danielle West is not covered by her insurance and ciclopirox was prescribed, this is helpful for her previously as well  Follow-up for laser visit as soon as possible visit  Return in about 3 months (around 09/25/2020) for nail re-check, laser treatment for nail fungus  before then.   Sharl Ma, DPM 06/25/2020

## 2020-06-25 NOTE — Telephone Encounter (Signed)
Pt called to say that her medication for the topical ointment needs a prior auth  From Dr Janifer Adie gthe pt has to pay 90.00 dollars please assist

## 2020-06-26 ENCOUNTER — Ambulatory Visit: Payer: BC Managed Care – PPO | Admitting: Podiatry

## 2020-07-18 ENCOUNTER — Other Ambulatory Visit: Payer: Self-pay

## 2020-07-18 ENCOUNTER — Ambulatory Visit (INDEPENDENT_AMBULATORY_CARE_PROVIDER_SITE_OTHER): Payer: BC Managed Care – PPO | Admitting: *Deleted

## 2020-07-18 DIAGNOSIS — B351 Tinea unguium: Secondary | ICD-10-CM

## 2020-07-18 NOTE — Progress Notes (Signed)
Patient presents today for the 1st laser treatment. Diagnosed with mycotic nail infection by Dr. Lilian Kapur.   Toenail most affected hallux left.  All other systems are negative.  Nails were filed thin. Laser therapy was administered to 1st toenails left and patient tolerated the treatment well. All safety precautions were in place.   She is also using Jublia topically.   Follow up in 4 weeks for laser # 2.  Picture of nails taken today to document visual progress

## 2020-08-16 ENCOUNTER — Other Ambulatory Visit: Payer: Self-pay

## 2020-08-16 ENCOUNTER — Ambulatory Visit (INDEPENDENT_AMBULATORY_CARE_PROVIDER_SITE_OTHER): Payer: BC Managed Care – PPO | Admitting: *Deleted

## 2020-08-16 DIAGNOSIS — B351 Tinea unguium: Secondary | ICD-10-CM

## 2020-08-16 NOTE — Progress Notes (Signed)
Patient presents today for the 2nd laser treatment. Diagnosed with mycotic nail infection by Dr. Lilian Kapur.   Toenail most affected hallux left. Not much change as of yet.  All other systems are negative.  Nails were filed thin. Laser therapy was administered to 1st toenails left and patient tolerated the treatment well. All safety precautions were in place.   She is also using Jublia topically.   Follow up in 4 weeks for laser # 3.

## 2020-09-20 ENCOUNTER — Other Ambulatory Visit: Payer: Self-pay

## 2020-09-20 ENCOUNTER — Ambulatory Visit (INDEPENDENT_AMBULATORY_CARE_PROVIDER_SITE_OTHER): Payer: BC Managed Care – PPO | Admitting: *Deleted

## 2020-09-20 DIAGNOSIS — B351 Tinea unguium: Secondary | ICD-10-CM

## 2020-09-20 NOTE — Progress Notes (Signed)
Patient presents today for the 3rd laser treatment. Diagnosed with mycotic nail infection by Dr. Lilian Kapur.   Toenail most affected hallux left. The nail is looking better overall. Still has darkened lines within the new growth.  All other systems are negative.  Nails were filed thin. Laser therapy was administered to 1st toenail left and patient tolerated the treatment well. All safety precautions were in place.   She is also using Jublia topically.   Follow up in 6 weeks for laser # 4.

## 2020-09-26 ENCOUNTER — Other Ambulatory Visit: Payer: Self-pay

## 2020-09-26 ENCOUNTER — Ambulatory Visit: Payer: BC Managed Care – PPO | Admitting: Podiatry

## 2020-09-26 DIAGNOSIS — B351 Tinea unguium: Secondary | ICD-10-CM

## 2020-09-26 DIAGNOSIS — M79675 Pain in left toe(s): Secondary | ICD-10-CM | POA: Diagnosis not present

## 2020-09-26 DIAGNOSIS — M79674 Pain in right toe(s): Secondary | ICD-10-CM | POA: Diagnosis not present

## 2020-09-26 MED ORDER — CICLOPIROX 8 % EX SOLN: Freq: Every day | CUTANEOUS | 2 refills | Status: DC

## 2020-09-26 NOTE — Progress Notes (Signed)
  Subjective:  Patient ID: Danielle West, female    DOB: 03/27/1976,  MRN: [id number]  Chief Complaint  Patient presents with  . Nail Problem    Nail check on left hallux. Pt stated that it is doing much better.    44 y.o. female presents with the above complaint. History confirmed with patient.  She has been undergoing laser therapy and penlac treatment with improvement  Objective:  Physical Exam: warm, good capillary refill, no trophic changes or ulcerative lesions, normal DP and PT pulses and normal sensory exam. Left Foot: Improvement with adhered hallux nail, dual hyperpigmented streaks limited to nail plate  Assessment:   1. Onychomycosis   2. Pain due to onychomycosis of toenails of both feet      Plan:  Patient was evaluated and treated and all questions answered.  Making good progress  Continue laser therapy  Continue penlac, refill sent to pharmacy  Return in about 3 months (around 12/27/2020) for nail re-check.   Sharl Ma, DPM 09/26/2020

## 2020-11-01 ENCOUNTER — Ambulatory Visit (INDEPENDENT_AMBULATORY_CARE_PROVIDER_SITE_OTHER): Payer: BC Managed Care – PPO | Admitting: *Deleted

## 2020-11-01 ENCOUNTER — Other Ambulatory Visit: Payer: Self-pay

## 2020-11-01 DIAGNOSIS — B351 Tinea unguium: Secondary | ICD-10-CM

## 2020-11-01 NOTE — Progress Notes (Signed)
Patient presents today for the 4th laser treatment. Diagnosed with mycotic nail infection by Dr. Lilian Kapur.   Toenail most affected hallux left. The nail is looking better overall. Still has darkened lines within the new growth.  All other systems are negative.  Nails were filed thin. Laser therapy was administered to 1st toenail left and patient tolerated the treatment well. All safety precautions were in place.   She was using Jublia, but now has switched over to Penlac.   Follow up in 6 weeks for laser # 5.

## 2020-12-20 ENCOUNTER — Other Ambulatory Visit: Payer: BC Managed Care – PPO

## 2020-12-23 ENCOUNTER — Other Ambulatory Visit: Payer: Self-pay

## 2020-12-23 ENCOUNTER — Ambulatory Visit (INDEPENDENT_AMBULATORY_CARE_PROVIDER_SITE_OTHER): Payer: BC Managed Care – PPO | Admitting: *Deleted

## 2020-12-23 DIAGNOSIS — B351 Tinea unguium: Secondary | ICD-10-CM

## 2020-12-23 NOTE — Progress Notes (Signed)
Patient presents today for the 5th laser treatment. Diagnosed with mycotic nail infection by Dr. Lilian Kapur.   Toenail most affected hallux left. The nail is looking better overall. Still has darkened lines within the new growth.  All other systems are negative.  Nails were filed thin. Laser therapy was administered to 1st toenail left and patient tolerated the treatment well. All safety precautions were in place.   She was using Jublia, but now has switched over to Penlac.She says she can't remember to put it on everyday like she should.   Follow up in 8 weeks for laser # 6.   ~Take final pic next visit~

## 2020-12-30 ENCOUNTER — Ambulatory Visit: Payer: BC Managed Care – PPO | Admitting: Podiatry

## 2020-12-31 ENCOUNTER — Other Ambulatory Visit: Payer: Self-pay

## 2020-12-31 ENCOUNTER — Encounter: Payer: Self-pay | Admitting: Podiatry

## 2020-12-31 ENCOUNTER — Ambulatory Visit: Payer: BC Managed Care – PPO | Admitting: Podiatry

## 2020-12-31 DIAGNOSIS — M79674 Pain in right toe(s): Secondary | ICD-10-CM | POA: Diagnosis not present

## 2020-12-31 DIAGNOSIS — M79675 Pain in left toe(s): Secondary | ICD-10-CM

## 2020-12-31 DIAGNOSIS — B351 Tinea unguium: Secondary | ICD-10-CM

## 2020-12-31 NOTE — Progress Notes (Signed)
  Subjective:  Patient ID: Danielle West, female    DOB: 06/16/1976,  MRN: [id number]  Chief Complaint  Patient presents with  . Nail Problem    Nail recheck left hallux    45 y.o. female returns with the above complaint. History confirmed with patient.  She has been undergoing laser therapy and penlac treatment with improvement  Objective:  Physical Exam: warm, good capillary refill, no trophic changes or ulcerative lesions, normal DP and PT pulses and normal sensory exam. Left Foot: Improvement with adhered hallux nail, dual hyperpigmented streaks limited to nail plate     Assessment:   1. Onychomycosis   2. Pain due to onychomycosis of toenails of both feet      Plan:  Patient was evaluated and treated and all questions answered.  Having less improvement now than she did after initial sessions. She has 1 additional treatment. I discussed with her that it would be up to her to continue beyond this, she should continue Penlac for now. We discussed the alternative of permanent total nail avulsion which she would like to avoid.   Return in about 4 months (around 04/30/2021).   Sharl Ma, DPM 12/31/2020

## 2021-02-17 ENCOUNTER — Other Ambulatory Visit: Payer: Self-pay

## 2021-02-17 ENCOUNTER — Ambulatory Visit (INDEPENDENT_AMBULATORY_CARE_PROVIDER_SITE_OTHER): Payer: BC Managed Care – PPO

## 2021-02-17 DIAGNOSIS — B351 Tinea unguium: Secondary | ICD-10-CM

## 2021-02-17 NOTE — Progress Notes (Signed)
Patient presents today for the 6th laser treatment. Diagnosed with mycotic nail infection by Dr. Lilian Kapur.   Toenail most affected hallux left. The nail is looking better overall. Still has darkened lines within the new growth.  All other systems are negative.  Nails were filed thin. Laser therapy was administered to 1st toenail left and patient tolerated the treatment well. All safety precautions were in place.   She was using Jublia, but now has switched over to Penlac.She says she can't remember to put it on everyday like she should.   Patient has completed the recommended laser treatments. She will follow up with Dr. Lilian Kapur in 3 months to evaluate progress.

## 2021-03-13 ENCOUNTER — Encounter: Payer: BC Managed Care – PPO | Admitting: Obstetrics and Gynecology

## 2021-03-26 ENCOUNTER — Encounter: Payer: Self-pay | Admitting: Obstetrics and Gynecology

## 2021-05-19 ENCOUNTER — Other Ambulatory Visit: Payer: Self-pay

## 2021-05-19 ENCOUNTER — Ambulatory Visit: Payer: BC Managed Care – PPO | Admitting: Podiatry

## 2021-05-19 ENCOUNTER — Encounter: Payer: Self-pay | Admitting: Podiatry

## 2021-05-19 DIAGNOSIS — B351 Tinea unguium: Secondary | ICD-10-CM

## 2021-05-19 MED ORDER — CICLOPIROX 8 % EX SOLN: Freq: Every day | CUTANEOUS | 2 refills | Status: DC

## 2021-05-19 MED ORDER — TERBINAFINE HCL 250 MG PO TABS: 250.0000 mg | ORAL_TABLET | Freq: Every day | ORAL | 0 refills | Status: AC

## 2021-05-20 ENCOUNTER — Ambulatory Visit: Payer: BC Managed Care – PPO | Admitting: Podiatry

## 2021-05-20 NOTE — Progress Notes (Signed)
  Subjective:  Patient ID: Danielle West, female    DOB: 1976/01/04,  MRN: [id number]  Chief Complaint  Patient presents with   Follow-up    Post laser follow up    45 y.o. female returns with the above complaint. History confirmed with patient.  She notes improvement with laser and Penlac use  Objective:  Physical Exam: warm, good capillary refill, no trophic changes or ulcerative lesions, normal DP and PT pulses and normal sensory exam. Left Foot: Persistent mycotic left hallux nail, no significant improvement since last visit with photographs      Assessment:   1. Onychomycosis      Plan:  Patient was evaluated and treated and all questions answered.  Has not seem to have had much improvement since the last visit.  Recommend she continue the Penlac.  Can hold off on labs for now.  Also recommended oral treatment with terbinafine which we discussed the risk, benefits and potential complications of as well as possible side effects.  She will begin taking this and let me know if she has any problems with it.  Follow-up in 4 months.

## 2021-05-28 ENCOUNTER — Telehealth: Payer: Self-pay | Admitting: *Deleted

## 2021-05-28 NOTE — Telephone Encounter (Signed)
Terbinafine is an antifungal and is used to treat fungus and also yeast infections so I doubt that has caused it. Is she on any antibiotics? If she is concerned it is the terbinafine she can stop it, see if the yeast infection resolves

## 2021-05-28 NOTE — Telephone Encounter (Signed)
Patient is calling because the medication prescribed (Terbinafine) may have caused a yeast infection. Please advise.

## 2021-05-30 NOTE — Telephone Encounter (Signed)
Returned call to patient and gave information per Dr Lilian Kapur, stated that she was diagnosed with a bacterial infection but it has since cleared up and will resume the terbinafine.She will contact with any more problems or concerns

## 2021-08-07 ENCOUNTER — Telehealth: Payer: Self-pay | Admitting: Podiatry

## 2021-08-07 NOTE — Telephone Encounter (Signed)
Patient stated that the Lamisil is giving her a yeast infection/ patient lips have turned brown. She has 10 pills left and would like to know if she should stop talking the medication.

## 2021-08-21 ENCOUNTER — Other Ambulatory Visit: Payer: Self-pay

## 2021-08-21 ENCOUNTER — Ambulatory Visit: Payer: BC Managed Care – PPO | Admitting: Podiatry

## 2021-08-21 DIAGNOSIS — B351 Tinea unguium: Secondary | ICD-10-CM

## 2021-08-21 MED ORDER — CICLOPIROX 8 % EX SOLN: Freq: Every day | CUTANEOUS | 4 refills | Status: AC

## 2021-08-25 NOTE — Progress Notes (Signed)
  Subjective:  Patient ID: Danielle West, female    DOB: 1976-03-08,  MRN: [id number]  Chief Complaint  Patient presents with   Nail Problem    Nail  fungus, medication follow up    45 y.o. female returns with the above complaint. History confirmed with patient.  Continues to improve with use of Penlac part of the nail split off looks to be better proximally  Objective:  Physical Exam: warm, good capillary refill, no trophic changes or ulcerative lesions, normal DP and PT pulses and normal sensory exam. Left Foot: Hallux nail with proximal clearing there is splitting of the nail at the mid substance       Assessment:   No diagnosis found.    Plan:  Patient was evaluated and treated and all questions answered.  She is improved quite a bit with terbinafine.  I recommended we continue with the ciclopirox for now.  Hopefully will show continued progress

## 2021-12-15 ENCOUNTER — Ambulatory Visit: Payer: BC Managed Care – PPO | Admitting: Podiatry

## 2021-12-15 ENCOUNTER — Other Ambulatory Visit: Payer: Self-pay

## 2021-12-15 DIAGNOSIS — B351 Tinea unguium: Secondary | ICD-10-CM | POA: Diagnosis not present

## 2021-12-17 NOTE — Progress Notes (Signed)
°  Subjective:  Patient ID: Danielle West, female    DOB: 1976/11/01,  MRN: [id number]  Chief Complaint  Patient presents with   Nail Problem     Left  Great toenail painful    46 y.o. female returns with the above complaint. History confirmed with patient.  Continues to improve with use of Penlac.  The corners of the nails are causing some discomfort  Objective:  Physical Exam: warm, good capillary refill, no trophic changes or ulcerative lesions, normal DP and PT pulses and normal sensory exam. Left Foot: Hallux nail with proximal clearing there is loose pieces in the       Assessment:   1. Onychomycosis       Plan:  Patient was evaluated and treated and all questions answered.  Doing well.  I think she can continue the ciclopirox as needed.  I also recommended urea gel 40% to soften and thin the nail and surrounding skin.  I will see her back in 4 months.

## 2022-02-20 ENCOUNTER — Ambulatory Visit: Payer: BC Managed Care – PPO | Admitting: Podiatry

## 2022-02-24 ENCOUNTER — Ambulatory Visit: Payer: BC Managed Care – PPO | Admitting: Podiatry

## 2022-03-23 ENCOUNTER — Ambulatory Visit: Payer: BC Managed Care – PPO | Admitting: Podiatry

## 2022-03-23 DIAGNOSIS — B351 Tinea unguium: Secondary | ICD-10-CM

## 2022-03-23 NOTE — Progress Notes (Signed)
?  Subjective:  ?Patient ID: Danielle West, female    DOB: 1976-08-13,  MRN: [id number] ? ?Chief Complaint  ?Patient presents with  ? Nail Problem  ?    follow up after nail fungus treatment. Thick painful toenails, 3 month follow up   ? ? ?46 y.o. female returns with the above complaint. History confirmed with patient.  She is doing well still having some pain in the medial hallux ? ?Objective:  ?Physical Exam: ?warm, good capillary refill, no trophic changes or ulcerative lesions, normal DP and PT pulses and normal sensory exam. ?Left Foot: Hallux nail has regrown completely with proximal clearing, no onychomycosis remains there is ingrowing of the medial border no paronychia or deep ingrown ? ? ? ? ? ? ?Assessment:  ? ?1. Onychomycosis   ? ? ? ? ?Plan:  ?Patient was evaluated and treated and all questions answered. ? ?Over doing well and I think she can discontinue the ciclopirox at this point.  Okay to resume pedicures.  I debrided the medial nail fold to alleviate pressure discussed with her if this continues to persist or be a problem a permanent partial matricectomy may alleviate this but I expect routine pedicure should be able to take care of this from this point on ?

## 2024-11-17 ENCOUNTER — Encounter: Payer: Self-pay | Admitting: Registered Nurse

## 2025-01-25 ENCOUNTER — Ambulatory Visit
